# Patient Record
Sex: Female | Born: 2000 | Race: Black or African American | Hispanic: No | Marital: Single | Smoking: Never smoker
Health system: Southern US, Community
[De-identification: ages and names within clinical notes are randomized; demographics above are authoritative.]

## PROBLEM LIST (undated history)

## (undated) ENCOUNTER — Ambulatory Visit (HOSPITAL_COMMUNITY)

## (undated) DIAGNOSIS — R824 Acetonuria: Secondary | ICD-10-CM

## (undated) DIAGNOSIS — L709 Acne, unspecified: Secondary | ICD-10-CM

## (undated) DIAGNOSIS — E119 Type 2 diabetes mellitus without complications: Secondary | ICD-10-CM

## (undated) DIAGNOSIS — E860 Dehydration: Secondary | ICD-10-CM

## (undated) DIAGNOSIS — E109 Type 1 diabetes mellitus without complications: Secondary | ICD-10-CM

## (undated) HISTORY — DX: Type 2 diabetes mellitus without complications: E11.9

## (undated) HISTORY — DX: Dehydration: E86.0

## (undated) HISTORY — DX: Type 1 diabetes mellitus without complications: E10.9

## (undated) HISTORY — PX: PILONIDAL CYST / SINUS EXCISION: SUR543

## (undated) HISTORY — DX: Acetonuria: R82.4

## (undated) HISTORY — DX: Acne, unspecified: L70.9

---
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
## 2018-02-08 ENCOUNTER — Ambulatory Visit (INDEPENDENT_AMBULATORY_CARE_PROVIDER_SITE_OTHER): Payer: BC Managed Care – PPO | Admitting: Pediatric Endocrinology

## 2018-02-08 ENCOUNTER — Encounter (INDEPENDENT_AMBULATORY_CARE_PROVIDER_SITE_OTHER): Payer: Self-pay | Admitting: Pediatric Endocrinology

## 2018-02-08 VITALS — BP 114/60 | HR 64 | Ht 61.5 in | Wt 130.4 lb

## 2018-02-08 DIAGNOSIS — E1065 Type 1 diabetes mellitus with hyperglycemia: Secondary | ICD-10-CM

## 2018-02-08 DIAGNOSIS — Z23 Encounter for immunization: Secondary | ICD-10-CM

## 2018-02-08 DIAGNOSIS — IMO0001 Reserved for inherently not codable concepts without codable children: Secondary | ICD-10-CM

## 2018-02-08 LAB — POCT GLYCOSYLATED HEMOGLOBIN (HGB A1C): HEMOGLOBIN A1C: 9 % — AB (ref 4.0–5.6)

## 2018-02-08 LAB — POCT GLUCOSE (DEVICE FOR HOME USE): POC GLUCOSE: 153 mg/dL — AB (ref 70–99)

## 2018-02-08 MED ORDER — GLUCAGON 3 MG/DOSE NA POWD: 1.0000 | NASAL | 3 refills | Status: DC | PRN

## 2018-02-08 NOTE — Progress Notes (Signed)
Subjective:  Patient Name: Alisha Church Date of Birth: [birth year]  MRN: [id number]  Alisha Church  presents to the office today for follow-up evaluation and management of her type 1 diabetes, growth delay, weight loss, and goiter  HISTORY OF PRESENT ILLNESS:   Alisha Church is a 18 y.o. African-American young lady.   Alisha Church was accompanied by herself   1. Alisha Church was admitted to [facility]'s pediatric ward on 07/19/2010 for new-onset type 1 diabetes mellitus, dehydration, and ketonuria. She had problems with polyuria, polydipsia, and thirst for several weeks prior to admission. She was initially quite dehydrated. Her blood glucose was 450. Her venous pH was 7.39. Her serum bicarbonate was 21. Her urine glucose was greater than 1000. Urine ketones were greater than 40. Hemoglobin A1c was 13.2%. Her insulin C-peptide was 0.52 (normal 0.80 to 3.9). She was started on Lantus insulin as a basal insulin. She was also started on Novolog aspart insulin as her bolus insulin at mealtimes, at bedtime and at 2 AM as needed. She was transitioned to a Medtronic 530G insulin pump 07/25/2012.  2. The patient's last [facility] visit was on 09/02/17. In the interim, she has been generally healthy.      She is still really liking having her T-Slim and Dexcom. She finds it very convenient. She got the email about the Control IQ upgrade and is excited about that.   She was meant to let me know what her sugars were like when she was at school- but she did not.   She has been having some issues with sensors failing early but Dexcom has been good about replacing sensors.   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   She feels that her sugars are variable. Sometimes they are good. And sometimes she feels that she could do better.   She thinks that she could make better food choices. She does feel that she boluses correctly and counts her carbs well. She tries to take at least 25 grams of carbs worth of  insulin before eating.   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  She is usually able to feel her lows.   She is changing her sites about every 3-4 days.   3. Pertinent Review of Systems:  Constitutional: The patient feels "good". The patient seems healthy and active. Eyes: Vision seems to be good with her glasses. There are no recognized eye problems. Her last eye exam was June 2019- got a change in her rx.  Neck: The patient has no complaints of anterior neck swelling, soreness, tenderness, pressure, discomfort, or difficulty swallowing.   Heart: Heart rate increases with exercise or other physical activity. The patient has no complaints of palpitations, irregular heart beats, chest pain, or chest pressure.   Lungs: No asthma or wheezing.,  Gastrointestinal: Bowel movents seem normal. The patient has no complaints of excessive hunger, acid reflux, upset stomach, stomach aches or pains, diarrhea, or constipation.  Legs: Muscle mass and strength seem normal. There are no complaints of numbness, tingling, burning, or pain. No edema is noted.  Feet: There are no obvious foot problems. There are no complaints of numbness, tingling, burning, or pain. No edema is noted. Neurologic: There are no recognized problems with muscle movement and strength, sensation, or coordination. GYN: LMP was 01/10/18  Diabetes ID: Alisha Church - not wearing today.  Wants to get a tattoo.   Annual Labs:  May 2019. No issues  4. CGM and pump download combination:   Avg BG of 225  Avg SG of 200  41% basal insulin  BG high was 468   68% above target   29% in target   3% below target   Testing 6.3 times per day  SG range 40-400 (feels that her 40 was not real)   57% above target   42% in target   1% below target   Some overnight hypoglycemia and a lot of overnight hyperglycemia.   Last visit:   Avg BG 192  Avg SG 187  Range BG 45-500  Range SG 40-400  CGM  time above  target 48%   Time in target 51%   Time below target 1%  46% basal  4.5 glucose values per day  Site change every 3.4 days.         PAST MEDICAL, FAMILY, AND SOCIAL HISTORY  Past Medical History:  Diagnosis Date  . Dehydration   . Diabetes mellitus   . Ketonuria   . Type 1 diabetes mellitus not at goal [facility])     Family History  Problem Relation Age of Onset  . Diabetes Maternal Aunt   . Heart disease Maternal Aunt   . Hypothyroidism Maternal Grandmother   . Thyroid disease Maternal Grandmother   . Heart disease Maternal Grandmother   . Diabetes Cousin   . Thyroid disease Mother      Current Outpatient Medications:  .  adapalene (DIFFERIN) 0.1 % cream, APPLY TO AFFECTED AREA NIGHTLY, Disp: , Rfl:  .  ACCU-CHEK FASTCLIX LANCETS MISC, USE AS DIRECTED 10 TIMES A DAY, Disp: 306 each, Rfl: 0 .  Blood Glucose Monitoring Suppl (ONETOUCH VERIO FLEX SYSTEM) w/Device KIT, 1 kit by Does not apply route as needed. Use to check blood glucose, Disp: 1 kit, Rfl: 6 .  diphenhydrAMINE (BENADRYL) 12.5 MG/5ML liquid, Take 25 mg by mouth 4 (four) times daily as needed. For allergies, Disp: , Rfl:  .  Glucagon (BAQSIMI TWO PACK) 3 MG/DOSE POWD, Place 1 each into the nose as needed (severe hypoglycmia with unresponsiveness)., Disp: 1 each, Rfl: 3 .  glucagon 1 MG injection, Inject 1.0 mg into anterior thigh one time if unconscious, unresponsive, unable to swallow and or has seizure., Disp: 2 each, Rfl: 4 .  glucose blood (BAYER CONTOUR NEXT TEST) test strip, Check glucose 7x daily, Disp: 250 each, Rfl: 5 .  hydrocortisone cream 1 %, Apply 1 application topically as needed. , Disp: , Rfl:  .  insulin aspart (NOVOLOG FLEXPEN) 100 UNIT/ML FlexPen, INJECT UP TO 21 UNITS SUBCUTANEOUSLY BEFORE MEALS, BEDTIME & PER PROTOCOLS FOR HYPERGLYCEMIA & DKA (Patient not taking: Reported on 08/12/2017), Disp: 15 pen, Rfl: 4 .  insulin aspart (NOVOLOG) 100 UNIT/ML injection, USE 300 UNITS IN INSULIN PUMP EVERY 48  HOURS., Disp: 5 vial, Rfl: 5 .  Insulin Glargine (BASAGLAR KWIKPEN) 100 UNIT/ML SOPN, Use up to 50 units daily (Patient not taking: Reported on 08/12/2017), Disp: 5 pen, Rfl: 6 .  Insulin Pen Needle (BD PEN NEEDLE NANO U/F) 32G X 4 MM MISC, Use with insulin pens 6 times daily (Patient not taking: Reported on 09/02/2017), Disp: 200 each, Rfl: 3 .  lidocaine-prilocaine (EMLA) cream, APPLY A SMALL AMOUNT TO SKIN AREA 45 MINUTES PRIOR TO INSERTING INFUSION SET, CLEAN SITE (Patient not taking: Reported on 12/24/2014), Disp: 30 g, Rfl: 2 .  ONETOUCH DELICA LANCETS 53Z MISC, Use to check BG 10x day, Disp: 300 each, Rfl: 4 .  ONETOUCH  VERIO test strip, USE TO TEST GLUCOSE SIX TIMES A DAY. (Patient not taking: Reported on 09/02/2017), Disp: 200 each, Rfl: 5 .  Pediatric Multivit-Minerals-C (KIDS GUMMY BEAR VITAMINS PO), Take 1 tablet by mouth daily., Disp: , Rfl:  .  Selenium Sulf-Pyrithione-Urea 2.25 % SHAM, APPLY TO AFFECTED AREA LEAVE IN PLACE 10 MINS RINSE OFF WITH WATER ONCE DAILY (Patient not taking: Reported on 09/17/2014), Disp: 180 mL, Rfl: 2 .  Selenium Sulfide 2.25 % SHAM, Apply topically., Disp: , Rfl:  .  Throat Lozenges (HALLS JUNIORS MT), Use as directed 1 lozenge in the mouth or throat as needed. For cough, Disp: , Rfl:   Allergies as of 02/08/2018  . (No Known Allergies)     reports that she has never smoked. She has never used smokeless tobacco. She reports that she does not drink alcohol or use drugs. Pediatric History  Patient Parents  . Heckstall,Danita (Mother)  . Muller,Quanji (Father)   Other Topics Concern  . Not on file  Social History Narrative   Lives with Mom, [place]. Boyfriend, 1 sister.    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   Objective:  Vital Signs:  BP 114/60   Pulse 64   Ht 5' 1.5" (1.562 m)   Wt 130 lb 6.4 oz (59.1 kg)   LMP  01/10/2018 (Exact Date)   BMI 24.24 kg/m  Blood pressure percentiles are not available for patients who are 18 years or older.   Ht Readings from Last 3 Encounters:  02/08/18 5' 1.5" (1.562 m) (14 %, Z= -1.09)*  09/02/17 5' 1.3" (1.557 m) (12 %, Z= -1.16)*  08/12/17 5' 1.61" (1.565 m) (15 %, Z= -1.04)*   * Growth percentiles are based on CDC (Girls, 2-20 Years) data.   Wt Readings from Last 3 Encounters:  02/08/18 130 lb 6.4 oz (59.1 kg) (57 %, Z= 0.18)*  09/02/17 129 lb 12.8 oz (58.9 kg) (58 %, Z= 0.20)*  08/12/17 132 lb 9.6 oz (60.1 kg) (63 %, Z= 0.33)*   * Growth percentiles are based on CDC (Girls, 2-20 Years) data.   HC Readings from Last 3 Encounters:  No data found for [facility]   Body surface area is 1.6 meters squared. 14 %ile (Z= -1.09) based on CDC (Girls, 2-20 Years) Stature-for-age data based on Stature recorded on 02/08/2018. 57 %ile (Z= 0.18) based on CDC (Girls, 2-20 Years) weight-for-age data using vitals from 02/08/2018.    PHYSICAL EXAM:  Constitutional: The patient appears healthy and well nourished. The patient's height and weight are normal for age. Weight is stable since last visit  Head: The head is normocephalic. Face: The face appears normal. There are no obvious dysmorphic features. Eyes: The eyes appear to be normally formed and spaced. Gaze is conjugate. There is no obvious arcus or proptosis. Moisture appears normal. Ears: The ears are normally placed and appear externally normal. Mouth: The oropharynx and tongue appear normal. Dentition appears to be normal for age. Oral moisture is normal. Neck: The neck appears to be visibly normal. The thyroid gland is normal for age The consistency of the thyroid gland is firm.. The thyroid gland is not tender to palpation. Lungs: The lungs are clear to auscultation. Air movement is good. Heart: Heart rate and rhythm are regular. Heart sounds S1 and S2 are normal. I did not appreciate any pathologic cardiac  murmurs.  Abdomen: The abdomen appears to be normal in size for the patient's age. Bowel sounds are normal. There is no obvious hepatomegaly, splenomegaly, or other mass effect.  Arms: Muscle size and bulk are normal for age. Hands: There is no obvious tremor. Phalangeal and metacarpophalangeal joints are normal. Palmar muscles are normal for age. Palmar skin is normal. Palmar moisture is also normal. Legs: Muscles appear normal for age. No edema is present. Feet: Feet are normally formed. Neurologic: Strength is normal for age in both the upper and lower extremities. Muscle tone is normal. Sensation to touch is normal in both the legs and feet.     LAB DATA:   Results for orders placed or performed in visit on 02/08/18 (from the past 504 hour(s))  POCT Glucose (Device for Home Use)   Collection Time: 02/08/18  1:29 PM  Result Value Ref Range   Glucose Fasting, POC     POC Glucose 153 (A) 70 - 99 mg/dl  POCT glycosylated hemoglobin (Hb A1C)   Collection Time: 02/08/18  1:34 PM  Result Value Ref Range   Hemoglobin A1C 9.0 (A) 4.0 - 5.6 %   HbA1c POC (<> result, manual entry)     HbA1c, POC (prediabetic range)     HbA1c, POC (controlled diabetic range)          Assessment and Plan:   ASSESSMENT: Samon is an 18 y.o. AA female with type 1 diabetes managed on insulin pump  1. Type 1 diabetes on insulin pump:  - POC A1C as above - POC BG as above - Has not contacted office with sugars - Glucose control is overall higher - Increase in A1C since last visit - No clear pattern for making adjustments. She admits she is inconsistent in her care - Discussed T-Slim Control IQ update 2. Hypoglycemia: rare- She was surprised to find that her T-slim stops giving insulin when she is low.  3. Goiter: Her thyroid gland is stable today.  4. Weight-- Weight is stable   PLAN:   1. Diagnostic: A1C and BG as above. Continue home monitoring. 2. Therapeutic:  Discussed changes since last  visit. Insulin pump settings:  Time Basal Rate Correction factor Carb ratio Target BG   12a 0.75 50 mg/dL 6 150 mg/dL  4a 1.20 50 mg/dL 6 150 mg/dL  6a 1.20 50 mg/dL 6 120 mg/dL  8a 0.95 50 mg/dL 6 120 mg/dL  9p 0.95 50 mg/dL 6 150 mg/dL   Total Basal 23.00 Units      Duration of insulin   3 hours       3. Marland Kitchen.Patient education:  Reviewed Primary school teacher.  Discussed impact of stress on blood sugars. Discussed flu shot today (recommended for all T1DM patients).  4. Follow-up: Return in about 3 months (around 05/10/2018).       Level of Service: This visit lasted in excess of 25 minutes. More than 50% of the visit was devoted to counseling. When a patient is on insulin, intensive monitoring of blood glucose levels is necessary to avoid hyperglycemia and hypoglycemia. Severe hyperglycemia/hypoglycemia can lead to hospital admissions and be life threatening.    Lelon Huh, MD

## 2018-02-08 NOTE — Patient Instructions (Addendum)
Flu shot today! Remember to move that arm! It will take 2 weeks for full immune effect. This injection may not prevent flu but should reduce severity of disease.   Baqsimi prescription sent to pharmacy.   You should get an email from [employer] on how to upgrade to Control IQ No changes to pump settings.   Rules of 150: Total carbs for day <150 grams Total exercise for week >150 minutes Target blood sugar 150   Insulin pump settings:  Time Basal Rate Correction factor Carb ratio Target BG   12a 0.75 50 mg/dL 6 428 mg/dL  4a 7.68 50 mg/dL 6 115 mg/dL  6a 7.26 50 mg/dL 6 203 mg/dL  8a 5.59 50 mg/dL 6 741 mg/dL  9p 6.38 50 mg/dL 6 453 mg/dL   Total Basal 64.68 Units      Duration of insulin   3 hours

## 2018-02-23 ENCOUNTER — Telehealth (INDEPENDENT_AMBULATORY_CARE_PROVIDER_SITE_OTHER): Payer: Self-pay

## 2018-02-23 NOTE — Telephone Encounter (Signed)
Left voicemail for patient and mom to call back so that we can talk to her about the Baqsimi  When they call back we need to give them the number to [facility]. [phone].  Baqsimi is not on their formulary so we are unable to push further on an appeal for the denied PA. They can contact [facility] and see if there are any copay assistance programs they can apply for.

## 2018-05-08 ENCOUNTER — Encounter (INDEPENDENT_AMBULATORY_CARE_PROVIDER_SITE_OTHER): Payer: Self-pay | Admitting: Pediatric Endocrinology

## 2018-05-10 ENCOUNTER — Other Ambulatory Visit: Payer: Self-pay

## 2018-05-10 ENCOUNTER — Ambulatory Visit (INDEPENDENT_AMBULATORY_CARE_PROVIDER_SITE_OTHER): Payer: BC Managed Care – PPO | Admitting: Pediatric Endocrinology

## 2018-05-10 ENCOUNTER — Encounter (INDEPENDENT_AMBULATORY_CARE_PROVIDER_SITE_OTHER): Payer: Self-pay | Admitting: Pediatric Endocrinology

## 2018-05-10 VITALS — BP 118/70 | HR 76 | Ht 61.42 in | Wt 135.4 lb

## 2018-05-10 DIAGNOSIS — E1065 Type 1 diabetes mellitus with hyperglycemia: Secondary | ICD-10-CM

## 2018-05-10 DIAGNOSIS — Z4681 Encounter for fitting and adjustment of insulin pump: Secondary | ICD-10-CM | POA: Diagnosis not present

## 2018-05-10 DIAGNOSIS — E11649 Type 2 diabetes mellitus with hypoglycemia without coma: Secondary | ICD-10-CM | POA: Diagnosis not present

## 2018-05-10 DIAGNOSIS — IMO0001 Reserved for inherently not codable concepts without codable children: Secondary | ICD-10-CM

## 2018-05-10 LAB — POCT GLYCOSYLATED HEMOGLOBIN (HGB A1C): Hemoglobin A1C: 8 % — AB (ref 4.0–5.6)

## 2018-05-10 LAB — POCT GLUCOSE (DEVICE FOR HOME USE): Glucose Fasting, POC: 96 mg/dL (ref 70–99)

## 2018-05-10 NOTE — Progress Notes (Signed)
Subjective:  Patient Name: Alisha Church Date of Birth: September 20, 2000  MRN: [id number]  Alisha Church  presents to the office today for follow-up evaluation and management of her type 1 diabetes, growth delay, weight loss, and goiter  HISTORY OF PRESENT ILLNESS:   Alisha Church is a 18 y.o. African-American young lady.   Alisha Church was accompanied by her mom  1. Alisha Church was admitted to [facility]'s pediatric ward on 07/19/2010 for new-onset type 1 diabetes mellitus, dehydration, and ketonuria. She had problems with polyuria, polydipsia, and thirst for several weeks prior to admission. She was initially quite dehydrated. Her blood glucose was 450. Her venous pH was 7.39. Her serum bicarbonate was 21. Her urine glucose was greater than 1000. Urine ketones were greater than 40. Hemoglobin A1c was 13.2%. Her insulin C-peptide was 0.52 (normal 0.80 to 3.9). She was started on Lantus insulin as a basal insulin. She was also started on Novolog aspart insulin as her bolus insulin at mealtimes, at bedtime and at 2 AM as needed. She was transitioned to a Medtronic 530G insulin pump 07/25/2012.  2. The patient's last [facility] visit was on 02/08/2018. In the interim, she has been generally healthy.     She has the Control IQ operating system now on her T-Slim and likes it a lot. She has still managed to have a few lows (one where she put her carbs into the units screen). Overall she feels that she is not having as much hypoglycemia as previously.   She is working out a lot more often. She is trying to be more conscientious about what she is eating.   She is living at home now that they closed the dorms. Alisha Church feels that she is eating less now that she is back home.   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   She is still very nervous about getting low at night. When she sees her sugar below 150 mg/dL at bedtime she will try to eat something to raise it. She is having a hard time trusting her  system to not let her get hypoglycemic.   She has been bolusing before eating more often.   She has been working at [employer]- she has been working mostly in the back. She is no longer having hypoglycemia at work.   3. Pertinent Review of Systems:  Constitutional: The patient feels "good". The patient seems healthy and active. Eyes: Vision seems to be good with her glasses. There are no recognized eye problems. Her last eye exam was June 2019- got a change in her rx.  Neck: The patient has no complaints of anterior neck swelling, soreness, tenderness, pressure, discomfort, or difficulty swallowing.   Heart: Heart rate increases with exercise or other physical activity. The patient has no complaints of palpitations, irregular heart beats, chest pain, or chest pressure.   Lungs: No asthma or wheezing.,  Gastrointestinal: Bowel movents seem normal. The patient has no complaints of excessive hunger, acid reflux, upset stomach, stomach aches or pains, diarrhea, or constipation.  Legs: Muscle mass and strength seem normal. There are no complaints of numbness, tingling, burning, or pain. No edema is noted.  Feet: There are no obvious foot problems. There are no complaints of numbness, tingling, burning, or pain. No edema is noted. Neurologic: There are no recognized problems with muscle movement and strength, sensation, or coordination. GYN: LMP was 04/15/18  Diabetes ID: charm bracelet - not wearing today.   Annual Labs:  May 2019. No issues  Due next visit  Pump settings Time Basal Rate Correction factor Carb ratio Target BG   12a 0.75 50 mg/dL 6 150 mg/dL  4a 1.20 50 mg/dL 6 150 mg/dL  6a 1.20 50 mg/dL 6 120 mg/dL  8a 0.95 50 mg/dL 6 120 mg/dL  9p 0.95 50 mg/dL 6 150 mg/dL   Total Basal 23.00 Units      Duration of insulin   3 hours      4. CGM and pump download combination:   Avg BG 229 +/- 54. Range 141-329   >250 25%    181-249 58%   80-180 2%  avg SG 177 +/69.  44-400   >250 18%   181-249 22%    80-180 58%   71-79 1%   <70 1%   Avg basal 41% of total daily insulin  Very stable blood sugars during the day. Some higher sugars after eating and overnight.         Last visit:   Avg BG of 225  Avg SG of 200  41% basal insulin  BG high was 468   68% above target   29% in target   3% below target   Testing 6.3 times per day  SG range 40-400 (feels that her 40 was not real)   57% above target   42% in target   1% below target   Some overnight hypoglycemia and a lot of overnight hyperglycemia.         PAST MEDICAL, FAMILY, AND SOCIAL HISTORY  Past Medical History:  Diagnosis Date  . Dehydration   . Diabetes mellitus   . Ketonuria   . Type 1 diabetes mellitus not at goal [facility])     Family History  Problem Relation Age of Onset  . Diabetes Maternal Aunt   . Heart disease Maternal Aunt   . Hypothyroidism Maternal Grandmother   . Thyroid disease Maternal Grandmother   . Heart disease Maternal Grandmother   . Diabetes Cousin   . Thyroid disease Mother      Current Outpatient Medications:  .  ACCU-CHEK FASTCLIX LANCETS MISC, USE AS DIRECTED 10 TIMES A DAY, Disp: 306 each, Rfl: 0 .  adapalene (DIFFERIN) 0.1 % cream, APPLY TO AFFECTED AREA NIGHTLY, Disp: , Rfl:  .  Blood Glucose Monitoring Suppl (ONETOUCH VERIO FLEX SYSTEM) w/Device KIT, 1 kit by Does not apply route as needed. Use to check blood glucose, Disp: 1 kit, Rfl: 6 .  glucagon 1 MG injection, Inject 1.0 mg into anterior thigh one time if unconscious, unresponsive, unable to swallow and or has seizure., Disp: 2 each, Rfl: 4 .  insulin aspart (NOVOLOG) 100 UNIT/ML injection, USE 300 UNITS IN INSULIN PUMP EVERY 48 HOURS., Disp: 5 vial, Rfl: 5 .  Pediatric Multivit-Minerals-C (KIDS GUMMY BEAR VITAMINS PO), Take 1 tablet by mouth daily., Disp: , Rfl:  .  Selenium Sulf-Pyrithione-Urea 2.25 % SHAM, APPLY TO AFFECTED AREA LEAVE IN PLACE 10 MINS RINSE OFF WITH WATER ONCE DAILY, Disp:  180 mL, Rfl: 2 .  diphenhydrAMINE (BENADRYL) 12.5 MG/5ML liquid, Take 25 mg by mouth 4 (four) times daily as needed. For allergies, Disp: , Rfl:  .  Glucagon (BAQSIMI TWO PACK) 3 MG/DOSE POWD, Place 1 each into the nose as needed (severe hypoglycmia with unresponsiveness). (Patient not taking: Reported on 05/10/2018), Disp: 1 each, Rfl: 3 .  glucose blood (BAYER CONTOUR NEXT TEST) test strip, Check glucose 7x daily (Patient not taking: Reported on 05/10/2018), Disp: 250 each, Rfl: 5 .  hydrocortisone cream 1 %, Apply 1 application topically as needed. , Disp: , Rfl:  .  insulin aspart (NOVOLOG FLEXPEN) 100 UNIT/ML FlexPen, INJECT UP TO 21 UNITS SUBCUTANEOUSLY BEFORE MEALS, BEDTIME & PER PROTOCOLS FOR HYPERGLYCEMIA & DKA (Patient not taking: Reported on 08/12/2017), Disp: 15 pen, Rfl: 4 .  Insulin Glargine (BASAGLAR KWIKPEN) 100 UNIT/ML SOPN, Use up to 50 units daily (Patient not taking: Reported on 08/12/2017), Disp: 5 pen, Rfl: 6 .  Insulin Pen Needle (BD PEN NEEDLE NANO U/F) 32G X 4 MM MISC, Use with insulin pens 6 times daily (Patient not taking: Reported on 09/02/2017), Disp: 200 each, Rfl: 3 .  ONETOUCH DELICA LANCETS 96E MISC, Use to check BG 10x day (Patient not taking: Reported on 05/10/2018), Disp: 300 each, Rfl: 4 .  ONETOUCH VERIO test strip, USE TO TEST GLUCOSE SIX TIMES A DAY. (Patient not taking: Reported on 09/02/2017), Disp: 200 each, Rfl: 5 .  Throat Lozenges (HALLS JUNIORS MT), Use as directed 1 lozenge in the mouth or throat as needed. For cough, Disp: , Rfl:   Allergies as of 05/10/2018  . (No Known Allergies)     reports that she has never smoked. She has never used smokeless tobacco. She reports that she does not drink alcohol or use drugs. Pediatric History  Patient Parents  . Heckstall,Danita (Mother)  . Maille,Quanji (Father)   Other Topics Concern  . Not on file  Social History Narrative   Lives with Mom, [place]. Boyfriend, 1 sister.    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  Primary Care Provider: Berkley Harvey, NP  ROS: There are no other significant problems involving Kiyoko's other body systems.   Objective:  Vital Signs:  BP 118/70   Pulse 76   Ht 5' 1.42" (1.56 m)   Wt 135 lb 6.4 oz (61.4 kg)   BMI 25.24 kg/m  Blood pressure percentiles are not available for patients who are 18 years or older.   Ht Readings from Last 3 Encounters:  05/10/18 5' 1.42" (1.56 m) (13 %, Z= -1.12)*  02/08/18 5' 1.5" (1.562 m) (14 %, Z= -1.09)*  09/02/17 5' 1.3" (1.557 m) (12 %, Z= -1.16)*   * Growth percentiles are based on CDC (Girls, 2-20 Years) data.   Wt Readings from Last 3 Encounters:  05/10/18 135 lb 6.4 oz (61.4 kg) (64 %, Z= 0.36)*  02/08/18 130 lb 6.4 oz (59.1 kg) (57 %, Z= 0.18)*  09/02/17 129 lb 12.8 oz (58.9 kg) (58 %, Z= 0.20)*   * Growth percentiles are based on CDC (Girls, 2-20 Years) data.   HC Readings from Last 3 Encounters:  No data found for [facility]   Body surface area is 1.63 meters squared. 13 %ile (Z= -1.12) based on CDC (Girls, 2-20 Years) Stature-for-age data based on Stature recorded on 05/10/2018. 64 %ile (Z= 0.36) based on CDC (Girls, 2-20 Years) weight-for-age data using vitals from 05/10/2018.    PHYSICAL EXAM:  Constitutional: The patient appears healthy and well nourished. The patient's height and weight are normal for age. Weight is + 4 pounds since last visit  Head: The head is normocephalic. Face: The face appears normal. There are no obvious dysmorphic features. Eyes: The eyes appear to be normally formed and spaced. Gaze is conjugate. There is no obvious arcus or proptosis. Moisture appears normal. Ears: The ears are normally placed and appear externally normal. Mouth: The oropharynx and tongue appear normal. Dentition appears to be normal for  age. Oral moisture is normal. Neck: The neck appears to be visibly normal. The thyroid gland is normal for age The consistency of the thyroid gland is firm.. The  thyroid gland is not tender to palpation. Lungs: The lungs are clear to auscultation. Air movement is good. Heart: Heart rate and rhythm are regular. Heart sounds S1 and S2 are normal. I did not appreciate any pathologic cardiac murmurs. Abdomen: The abdomen appears to be normal in size for the patient's age. Bowel sounds are normal. There is no obvious hepatomegaly, splenomegaly, or other mass effect.  Arms: Muscle size and bulk are normal for age. Hands: There is no obvious tremor. Phalangeal and metacarpophalangeal joints are normal. Palmar muscles are normal for age. Palmar skin is normal. Palmar moisture is also normal. Legs: Muscles appear normal for age. No edema is present. Feet: Feet are normally formed. Neurologic: Strength is normal for age in both the upper and lower extremities. Muscle tone is normal. Sensation to touch is normal in both the legs and feet.   Skin: some scarring around pump site on right buttock  LAB DATA:   Results for orders placed or performed in visit on 05/10/18 (from the past 504 hour(s))  POCT Glucose (Device for Home Use)   Collection Time: 05/10/18  4:03 PM  Result Value Ref Range   Glucose Fasting, POC 96 70 - 99 mg/dL   POC Glucose    POCT glycosylated hemoglobin (Hb A1C)   Collection Time: 05/10/18  4:12 PM  Result Value Ref Range   Hemoglobin A1C 8.0 (A) 4.0 - 5.6 %   HbA1c POC (<> result, manual entry)     HbA1c, POC (prediabetic range)     HbA1c, POC (controlled diabetic range)       Last A1C 9%    Assessment and Plan:   ASSESSMENT: Zali is an 18 y.o. AA female with type 1 diabetes managed on insulin pump   1. Type 1 diabetes on insulin pump:  - POC A1C as above - POC BG as above - Glucose control is overall tighter - decrease in A1C since last visit - Glycemic control is very stable during the day and higher at night - she has had fewer hypoglycemic episodes  2. Hypoglycemia: rare- Control IQ is preventing most hypoglycemic  episodes 3. Goiter: Her thyroid gland is stable today.    PLAN:  1. Diagnostic: A1C and BG as above. Continue home monitoring. Annual labs next visit 2. Therapeutic:  Discussed changes since last visit. Insulin pump settings:  Time Basal Rate Correction factor Carb ratio Target BG   12a 0.75 -> 0.85 50 -> 42m/dL 6 150 mg/dL  4a 1.20 50-> 40 mg/dL 6 150 mg/dL  6a 1.20 50 -> 40 mg/dL 6 120 mg/dL  8a 0.95 50 -> 46mdL 6 120 mg/dL  9p 0.95 -> 1.05 50 -> 4057mL 6 150 mg/dL   Total Basal 23.00 Units -> 23.7     Duration of insulin   3 hours        3. ..PMarland Kitchentient education:  Reviewed pumPrimary school teacherTitrated pump settings as above.  4. Follow-up: Return in about 3 months (around 08/09/2018).       Level of Service: This visit lasted in excess of 40 minutes. More than 50% of the visit was devoted to counseling. When a patient is on insulin, intensive monitoring of blood glucose levels is necessary to avoid hyperglycemia and hypoglycemia. Severe hyperglycemia/hypoglycemia can lead to hospital admissions and be life  threatening.    Lelon Huh, MD

## 2018-05-10 NOTE — Patient Instructions (Addendum)
  Time Basal Rate Correction factor Carb ratio Target BG   12a 0.75 -> 0.85 50 -> 40mg /dL 6 929 mg/dL  4a 5.74 73-> 40 mg/dL 6 403 mg/dL  6a 7.09 50 -> 40 mg/dL 6 643 mg/dL  8a 8.38 50 -> 40mg /dL 6 184 mg/dL  9p 0.37 -> 5.43 50 -> 40mg /dL 6 606 mg/dL   Total Basal 77.03 Units -> 23.7     Duration of insulin   3 hours

## 2018-08-16 ENCOUNTER — Ambulatory Visit (INDEPENDENT_AMBULATORY_CARE_PROVIDER_SITE_OTHER): Payer: BC Managed Care – PPO | Admitting: Pediatric Endocrinology

## 2018-09-07 ENCOUNTER — Emergency Department (HOSPITAL_COMMUNITY)
Admission: EM | Admit: 2018-09-07 | Discharge: 2018-09-07 | Disposition: A | Discharge status: Home / Self Care | Payer: BC Managed Care – PPO | Attending: Emergency Medicine | Admitting: Emergency Medicine

## 2018-09-07 ENCOUNTER — Other Ambulatory Visit: Payer: Self-pay

## 2018-09-07 ENCOUNTER — Encounter (HOSPITAL_COMMUNITY): Payer: Self-pay | Admitting: Emergency Medicine

## 2018-09-07 DIAGNOSIS — N3000 Acute cystitis without hematuria: Secondary | ICD-10-CM | POA: Diagnosis not present

## 2018-09-07 DIAGNOSIS — Z794 Long term (current) use of insulin: Secondary | ICD-10-CM | POA: Insufficient documentation

## 2018-09-07 DIAGNOSIS — R102 Pelvic and perineal pain: Secondary | ICD-10-CM | POA: Diagnosis present

## 2018-09-07 DIAGNOSIS — E109 Type 1 diabetes mellitus without complications: Secondary | ICD-10-CM | POA: Diagnosis not present

## 2018-09-07 DIAGNOSIS — Z79899 Other long term (current) drug therapy: Secondary | ICD-10-CM | POA: Insufficient documentation

## 2018-09-07 LAB — URINALYSIS, ROUTINE W REFLEX MICROSCOPIC
Bilirubin Urine: NEGATIVE
Glucose, UA: >=500 mg/dL — AB
Hgb urine dipstick: NEGATIVE
Ketones, ur: 80 mg/dL — AB
Leukocytes,Ua: NEGATIVE
Nitrite: NEGATIVE
Protein, ur: NEGATIVE mg/dL
Specific Gravity, Urine: 1.023 (ref 1.005–1.030)
pH: 5 (ref 5.0–8.0)

## 2018-09-07 LAB — POC URINE PREG, ED: Preg Test, Ur: NEGATIVE

## 2018-09-07 LAB — CBG MONITORING, ED: Glucose-Capillary: 164 mg/dL — ABNORMAL HIGH (ref 70–99)

## 2018-09-07 MED ORDER — CEPHALEXIN 500 MG PO CAPS: 500.0000 mg | ORAL_CAPSULE | Freq: Three times a day (TID) | ORAL | 0 refills | Status: DC

## 2018-09-07 MED ORDER — CEPHALEXIN 500 MG PO CAPS
500.0000 mg | ORAL_CAPSULE | Freq: Once | ORAL | Status: AC
  Administered 2018-09-07: 500 mg via ORAL
  Filled 2018-09-07: qty 1

## 2018-09-07 MED ORDER — PHENAZOPYRIDINE HCL 100 MG PO TABS
200.0000 mg | ORAL_TABLET | Freq: Once | ORAL | Status: AC
  Administered 2018-09-07: 02:00:00 200 mg via ORAL
  Filled 2018-09-07: qty 2

## 2018-09-07 MED ORDER — PHENAZOPYRIDINE HCL 200 MG PO TABS: 200.0000 mg | ORAL_TABLET | Freq: Three times a day (TID) | ORAL | 0 refills | Status: DC

## 2018-09-07 NOTE — ED Provider Notes (Signed)
[facility] EMERGENCY DEPARTMENT Provider Note   CSN: [id number] Arrival date & time: 09/07/18  0016   Time seen 1:30 AM  History   Chief Complaint Chief Complaint  Patient presents with  . Pelvic Pain    HPI Alisha Church is a 18 y.o. female.     HPI patient states this morning, August 11 when she woke up she started having pressure over her bladder area.  She has had dysuria, frequency, urinating small amounts but without hematuria.  She denies nausea, vomiting, fever, or flank pain.  She denies any vaginal discharge.  She denies any sexual activity at all.  She states she has never had this before.  She states her CBGs are usually in the 130 range, during my exam it said it was 194.  She denies any chest pain or shortness of breath.  PCP Berkley Harvey, NP   Past Medical History:  Diagnosis Date  . Dehydration   . Diabetes mellitus   . Ketonuria   . Type 1 diabetes mellitus not at goal [facility])     Patient Active Problem List   Diagnosis Date Noted  . Weight loss, unintentional 04/17/2014  . Vaginal candidiasis 02/26/2014  . History of pilonidal cyst 02/26/2014  . 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 Insulin pump titration 08/02/2012  . Overweight(278.02) 04/07/2012  . Sports physical 10/22/2011  . Hypoglycemia associated with diabetes ([place]) 05/04/2011  . Type 1 diabetes mellitus not at goal [facility])   . Goiter     Past Surgical History:  Procedure Laterality Date  . PILONIDAL CYST / SINUS EXCISION       OB History   No obstetric history on file.      Home Medications    Prior to Admission medications   Medication Sig Start Date End Date Taking? Authorizing Provider  ACCU-CHEK FASTCLIX LANCETS MISC USE AS DIRECTED 10 TIMES A DAY 06/24/16   Lelon Huh, MD  adapalene (DIFFERIN) 0.1 % cream APPLY TO AFFECTED AREA NIGHTLY 08/13/16   [provider]  Blood Glucose Monitoring Suppl (Joiner)  w/Device KIT 1 kit by Does not apply route as needed. Use to check blood glucose 02/05/15   Lelon Huh, MD  cephALEXin (KEFLEX) 500 MG capsule Take 1 capsule (500 mg total) by mouth 3 (three) times daily. 09/07/18   Rolland Porter, MD  diphenhydrAMINE (BENADRYL) 12.5 MG/5ML liquid Take 25 mg by mouth 4 (four) times daily as needed. For allergies    [provider]  Glucagon (BAQSIMI TWO PACK) 3 MG/DOSE POWD Place 1 each into the nose as needed (severe hypoglycmia with unresponsiveness). Patient not taking: Reported on 05/10/2018 02/08/18   Lelon Huh, MD  glucagon 1 MG injection Inject 1.0 mg into anterior thigh one time if unconscious, unresponsive, unable to swallow and or has seizure. 06/24/16   Lelon Huh, MD  glucose blood (BAYER CONTOUR NEXT TEST) test strip Check glucose 7x daily Patient not taking: Reported on 05/10/2018 11/07/14   Lelon Huh, MD  hydrocortisone cream 1 % Apply 1 application topically as needed.     [provider]  insulin aspart (NOVOLOG FLEXPEN) 100 UNIT/ML FlexPen INJECT UP TO 21 UNITS SUBCUTANEOUSLY BEFORE MEALS, BEDTIME & PER PROTOCOLS FOR HYPERGLYCEMIA & DKA Patient not taking: Reported on 08/12/2017 05/08/17   Lelon Huh, MD  insulin aspart (NOVOLOG) 100 UNIT/ML injection USE 300 UNITS IN INSULIN PUMP EVERY 48 HOURS. 09/28/17   Lelon Huh, MD  Insulin  Glargine (BASAGLAR KWIKPEN) 100 UNIT/ML SOPN Use up to 50 units daily Patient not taking: Reported on 08/12/2017 05/08/17   Lelon Huh, MD  Insulin Pen Needle (BD PEN NEEDLE NANO U/F) 32G X 4 MM MISC Use with insulin pens 6 times daily Patient not taking: Reported on 09/02/2017 05/23/12   Lelon Huh, MD  [facility] DELICA LANCETS 45W MISC Use to check BG 10x day Patient not taking: Reported on 05/10/2018 10/03/15   Lelon Huh, MD  [facility] VERIO test strip USE TO TEST GLUCOSE SIX TIMES A DAY. Patient not taking: Reported on 09/02/2017 07/16/17   Lelon Huh, MD  Pediatric  Multivit-Minerals-C (KIDS GUMMY BEAR VITAMINS PO) Take 1 tablet by mouth daily.    [provider]  phenazopyridine (PYRIDIUM) 200 MG tablet Take 1 tablet (200 mg total) by mouth 3 (three) times daily. 09/07/18   Rolland Porter, MD  Selenium Sulf-Pyrithione-Urea 2.25 % SHAM APPLY TO AFFECTED AREA LEAVE IN PLACE 10 MINS RINSE OFF WITH WATER ONCE DAILY    Prose, Hurshel Keys, MD  Throat Lozenges (HALLS JUNIORS MT) Use as directed 1 lozenge in the mouth or throat as needed. For cough    [provider]    Family History Family History  Problem Relation Age of Onset  . Diabetes Maternal Aunt   . Heart disease Maternal Aunt   . Hypothyroidism Maternal Grandmother   . Thyroid disease Maternal Grandmother   . Heart disease Maternal Grandmother   . Diabetes Cousin   . Thyroid disease Mother     Social History Social History   Tobacco Use  . Smoking status: Never Smoker  . Smokeless tobacco: Never Used  Substance Use Topics  . Alcohol use: No  . Drug use: No     Allergies   Patient has no known allergies.   Review of Systems Review of Systems  All other systems reviewed and are negative.    Physical Exam Updated Vital Signs BP 139/79 (BP Location: Right Arm)   Pulse 71   Temp 98.3 F (36.8 C) (Oral)   Resp 17   Ht 5' 1" (1.549 m)   Wt 61.7 kg   LMP 08/10/2018   SpO2 100%   BMI 25.70 kg/m   Vital signs normal    Physical Exam Vitals signs and nursing note reviewed.  Constitutional:      General: She is not in acute distress.    Appearance: Normal appearance. She is well-developed. She is not ill-appearing or toxic-appearing.  HENT:     Head: Normocephalic and atraumatic.     Right Ear: External ear normal.     Left Ear: External ear normal.     Nose: Nose normal. No mucosal edema or rhinorrhea.     Mouth/Throat:     Dentition: No dental abscesses.     Pharynx: No uvula swelling.  Eyes:     Extraocular Movements: Extraocular movements intact.      Conjunctiva/sclera: Conjunctivae normal.     Pupils: Pupils are equal, round, and reactive to light.  Neck:     Musculoskeletal: Full passive range of motion without pain, normal range of motion and neck supple.  Cardiovascular:     Rate and Rhythm: Normal rate and regular rhythm.     Heart sounds: Normal heart sounds. No murmur. No friction rub. No gallop.   Pulmonary:     Effort: Pulmonary effort is normal. No respiratory distress.     Breath sounds: Normal breath sounds. No wheezing, rhonchi or rales.  Chest:     Chest wall: No tenderness or crepitus.  Abdominal:     General: Bowel sounds are normal. There is no distension.     Palpations: Abdomen is soft.     Tenderness: There is abdominal tenderness in the suprapubic area. There is no right CVA tenderness, left CVA tenderness, guarding or rebound.  Musculoskeletal: Normal range of motion.        General: No tenderness.     Comments: Moves all extremities well.   Skin:    General: Skin is warm and dry.     Coloration: Skin is not pale.     Findings: No erythema or rash.  Neurological:     Mental Status: She is alert and oriented to person, place, and time.     Cranial Nerves: No cranial nerve deficit.  Psychiatric:        Mood and Affect: Mood is not anxious.        Speech: Speech normal.        Behavior: Behavior normal.      ED Treatments / Results  Labs (all labs ordered are listed, but only abnormal results are displayed) Results for orders placed or performed during the hospital encounter of 09/07/18  Urinalysis, Routine w reflex microscopic  Result Value Ref Range   Color, Urine YELLOW YELLOW   APPearance HAZY (A) CLEAR   Specific Gravity, Urine 1.023 1.005 - 1.030   pH 5.0 5.0 - 8.0   Glucose, UA >=500 (A) NEGATIVE mg/dL   Hgb urine dipstick NEGATIVE NEGATIVE   Bilirubin Urine NEGATIVE NEGATIVE   Ketones, ur 80 (A) NEGATIVE mg/dL   Protein, ur NEGATIVE NEGATIVE mg/dL   Nitrite NEGATIVE NEGATIVE    Leukocytes,Ua NEGATIVE NEGATIVE   RBC / HPF 0-5 0 - 5 RBC/hpf   WBC, UA 0-5 0 - 5 WBC/hpf   Bacteria, UA RARE (A) NONE SEEN   Squamous Epithelial / LPF 0-5 0 - 5   Mucus PRESENT   POC Urine Pregnancy, ED (not at [facility])  Result Value Ref Range   Preg Test, Ur NEGATIVE NEGATIVE  CBG monitoring, ED  Result Value Ref Range   Glucose-Capillary 164 (H) 70 - 99 mg/dL   Laboratory interpretation all normal except glucosuria, mild hyperglycemia, mild ketosis in urine.    EKG None  Radiology No results found.  Procedures Procedures (including critical care time)  Medications Ordered in ED Medications  phenazopyridine (PYRIDIUM) tablet 200 mg (has no administration in time range)  cephALEXin (KEFLEX) capsule 500 mg (has no administration in time range)     Initial Impression / Assessment and Plan / ED Course  I have reviewed the triage vital signs and the nursing notes.  Pertinent labs & imaging results that were available during my care of the patient were reviewed by me and considered in my medical decision making (see chart for details).     Patient looks comfortable at rest.  She does not appear to be in DKA, she is not having any tachypnea or chest pain.  She does have some lower abdominal pain however she denies any prior sexual activity at all, her urinalysis does not specifically look like a UTI although her symptoms sound like a urinary tract infection.  I do not suspect any other underlying process such as diverticulitis or appendicitis although if her symptoms get worse that would be something to consider.  Final Clinical Impressions(s) / ED Diagnoses   Final diagnoses:  Acute cystitis without hematuria      ED Discharge Orders         Ordered    phenazopyridine (PYRIDIUM) 200 MG tablet  3 times daily     09/07/18 0156    cephALEXin (KEFLEX) 500 MG capsule  3 times daily     09/07/18 0156         Plan discharge  Rolland Porter, MD, Barbette Or, MD  09/07/18 0330

## 2018-09-07 NOTE — Discharge Instructions (Signed)
Drink plenty of fluids, continue to monitor your blood sugar closely as you are doing.  Take the antibiotics until gone, take the Pyridium 3 times a day for the discomfort with urination.  Please wear old underwear or pantiliners because it will stain if you have leakage.  Recheck if you get fever, vomiting, pain in your back or you seem worse instead of better.

## 2018-09-07 NOTE — ED Triage Notes (Signed)
Pt stating when she uses the bathroom it feels like "my bladder is hurting." Pt describes it as and aching and pressure pain. Pt reports this problem began around 0800 this morning. Denies fevers and N/V.

## 2018-09-08 LAB — GC/CHLAMYDIA PROBE AMP (~~LOC~~) NOT AT ARMC
Chlamydia: NEGATIVE
Neisseria Gonorrhea: NEGATIVE

## 2018-10-25 ENCOUNTER — Ambulatory Visit (INDEPENDENT_AMBULATORY_CARE_PROVIDER_SITE_OTHER): Payer: BC Managed Care – PPO | Admitting: Pediatric Endocrinology

## 2018-10-25 ENCOUNTER — Encounter (INDEPENDENT_AMBULATORY_CARE_PROVIDER_SITE_OTHER): Payer: Self-pay | Admitting: Pediatric Endocrinology

## 2018-10-25 ENCOUNTER — Other Ambulatory Visit: Payer: Self-pay

## 2018-10-25 VITALS — BP 108/68 | HR 94 | Wt 131.0 lb

## 2018-10-25 DIAGNOSIS — E1065 Type 1 diabetes mellitus with hyperglycemia: Secondary | ICD-10-CM | POA: Diagnosis not present

## 2018-10-25 DIAGNOSIS — IMO0001 Reserved for inherently not codable concepts without codable children: Secondary | ICD-10-CM

## 2018-10-25 LAB — POCT GLUCOSE (DEVICE FOR HOME USE): POC Glucose: 274 mg/dl — AB (ref 70–99)

## 2018-10-25 LAB — POCT GLYCOSYLATED HEMOGLOBIN (HGB A1C): Hemoglobin A1C: 7.4 % — AB (ref 4.0–5.6)

## 2018-10-25 NOTE — Patient Instructions (Signed)
No changes to settings. Work on carb counting. Use temp basal after skating.    Time Basal Rate Correction factor Carb ratio Target BG   12a 0.85 40mg /dL 6 150 mg/dL  4a 1.20 40 mg/dL 6 150 mg/dL  6a 1.20 40 mg/dL 6 120 mg/dL  8a 0.95 40mg /dL 6 120 mg/dL  9p 1.05 40mg /dL 6 150 mg/dL   Total Basal  23.7     Duration of insulin   3 hours

## 2018-10-25 NOTE — Progress Notes (Signed)
Subjective:  Patient Name: [facility] Date of Birth: 2000/03/11  MRN: [id number]  Wetona Viramontes  presents to the office today for follow-up evaluation and management of her type 1 diabetes, growth delay, weight loss, and goiter  HISTORY OF PRESENT ILLNESS:   Amali is a 18 y.o. African-American young lady.   Domique was accompanied by her mom  1. Kindsey was admitted to [facility]'s pediatric ward on 07/19/2010 for new-onset type 1 diabetes mellitus, dehydration, and ketonuria. She had problems with polyuria, polydipsia, and thirst for several weeks prior to admission. She was initially quite dehydrated. Her blood glucose was 450. Her venous pH was 7.39. Her serum bicarbonate was 21. Her urine glucose was greater than 1000. Urine ketones were greater than 40. Hemoglobin A1c was 13.2%. Her insulin C-peptide was 0.52 (normal 0.80 to 3.9). She was started on Lantus insulin as a basal insulin. She was also started on Novolog aspart insulin as her bolus insulin at mealtimes, at bedtime and at 2 AM as needed. She was transitioned to a Medtronic 530G insulin pump 07/25/2012.  2. The patient's last [facility] visit was on 05/10/2018. In the interim, she has been generally healthy.   She has not been in the hospital or ER.   She has Control IQ on her pump. She feels that it does ok preventing hypoglycemia but it is not as good at hyperglycemia. She has had lower blood sugars overnight after skating in the evenings.   She feels that her carb counting is "good" but not "excellent". She is working on eating lower carb. She thinks that she tends to eat more carbs at dinner than at lunch. She is bolusing before she eats.   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 ([place])  3. Pertinent Review of Systems:  Constitutional: The patient feels "pretty good". The patient seems healthy and active. Eyes: Vision seems to be good with her glasses. There are no recognized eye problems. Her last eye exam was  June 2019- got a change in her rx.  Neck: The patient has no complaints of anterior neck swelling, soreness, tenderness, pressure, discomfort, or difficulty swallowing.   Heart: Heart rate increases with exercise or other physical activity. The patient has no complaints of palpitations, irregular heart beats, chest pain, or chest pressure.   Lungs: No asthma or wheezing.,  Gastrointestinal: Bowel movents seem normal. The patient has no complaints of excessive hunger, acid reflux, upset stomach, stomach aches or pains, diarrhea, or constipation.  Legs: Muscle mass and strength seem normal. There are no complaints of numbness, tingling, burning, or pain. No edema is noted.  Feet: There are no obvious foot problems. There are no complaints of numbness, tingling, burning, or pain. No edema is noted. Neurologic: There are no recognized problems with muscle movement and strength, sensation, or coordination. GYN: LMP was 2 weeks ago  Diabetes ID: charm bracelet - not wearing today. - at home   Annual Labs:  May 2019. No issues  Due TODAY   Time Basal Rate Correction factor Carb ratio Target BG   12a 0.85 77m/dL 6 150 mg/dL  4a 1.20 40 mg/dL 6 150 mg/dL  6a 1.20 40 mg/dL 6 120 mg/dL  8a 0.95 432mdL 6 120 mg/dL  9p 1.05 4045mL 6 150 mg/dL   Total Basal  23.7     Duration of insulin   3 hours        4. CGM and pump download combination:   Avg BG 185. Range  78-359   48%>180   43% in target   10% below target  Avg SG 164. Range 50-397   34% above target   64% in target   2% below target  48% basal.   Last Visit:   Avg BG 229 +/- 54. Range 141-329   >250 25%    181-249 58%   80-180 2%  avg SG 177 +/69. 44-400   >250 18%   181-249 22%    80-180 58%   71-79 1%   <70 1%   Avg basal 41% of total daily insulin  Very stable blood sugars during the day. Some higher sugars after eating and overnight.            PAST MEDICAL, FAMILY, AND SOCIAL HISTORY  Past Medical History:   Diagnosis Date  . Dehydration   . Diabetes mellitus   . Ketonuria   . Type 1 diabetes mellitus not at goal [facility])     Family History  Problem Relation Age of Onset  . Diabetes Maternal Aunt   . Heart disease Maternal Aunt   . Hypothyroidism Maternal Grandmother   . Thyroid disease Maternal Grandmother   . Heart disease Maternal Grandmother   . Diabetes Cousin   . Thyroid disease Mother      Current Outpatient Medications:  .  glucagon 1 MG injection, Inject 1.0 mg into anterior thigh one time if unconscious, unresponsive, unable to swallow and or has seizure., Disp: 2 each, Rfl: 4 .  insulin aspart (NOVOLOG) 100 UNIT/ML injection, USE 300 UNITS IN INSULIN PUMP EVERY 48 HOURS., Disp: 5 vial, Rfl: 5 .  Pediatric Multivit-Minerals-C (KIDS GUMMY BEAR VITAMINS PO), Take 1 tablet by mouth daily., Disp: , Rfl:  .  ACCU-CHEK FASTCLIX LANCETS MISC, USE AS DIRECTED 10 TIMES A DAY (Patient not taking: Reported on 10/25/2018), Disp: 306 each, Rfl: 0 .  adapalene (DIFFERIN) 0.1 % cream, APPLY TO AFFECTED AREA NIGHTLY, Disp: , Rfl:  .  Blood Glucose Monitoring Suppl (ONETOUCH VERIO FLEX SYSTEM) w/Device KIT, 1 kit by Does not apply route as needed. Use to check blood glucose (Patient not taking: Reported on 10/25/2018), Disp: 1 kit, Rfl: 6 .  cephALEXin (KEFLEX) 500 MG capsule, Take 1 capsule (500 mg total) by mouth 3 (three) times daily. (Patient not taking: Reported on 10/25/2018), Disp: 15 capsule, Rfl: 0 .  diphenhydrAMINE (BENADRYL) 12.5 MG/5ML liquid, Take 25 mg by mouth 4 (four) times daily as needed. For allergies, Disp: , Rfl:  .  Glucagon (BAQSIMI TWO PACK) 3 MG/DOSE POWD, Place 1 each into the nose as needed (severe hypoglycmia with unresponsiveness). (Patient not taking: Reported on 05/10/2018), Disp: 1 each, Rfl: 3 .  glucose blood (BAYER CONTOUR NEXT TEST) test strip, Check glucose 7x daily (Patient not taking: Reported on 05/10/2018), Disp: 250 each, Rfl: 5 .  hydrocortisone cream 1 %,  Apply 1 application topically as needed. , Disp: , Rfl:  .  insulin aspart (NOVOLOG FLEXPEN) 100 UNIT/ML FlexPen, INJECT UP TO 21 UNITS SUBCUTANEOUSLY BEFORE MEALS, BEDTIME & PER PROTOCOLS FOR HYPERGLYCEMIA & DKA (Patient not taking: Reported on 08/12/2017), Disp: 15 pen, Rfl: 4 .  Insulin Glargine (BASAGLAR KWIKPEN) 100 UNIT/ML SOPN, Use up to 50 units daily (Patient not taking: Reported on 08/12/2017), Disp: 5 pen, Rfl: 6 .  Insulin Pen Needle (BD PEN NEEDLE NANO U/F) 32G X 4 MM MISC, Use with insulin pens 6 times daily (Patient not taking: Reported on 09/02/2017), Disp: 200 each, Rfl: 3 .  ONETOUCH DELICA LANCETS 68L MISC, Use to check BG 10x day (Patient not taking: Reported on 05/10/2018), Disp: 300 each, Rfl: 4 .  ONETOUCH VERIO test strip, USE TO TEST GLUCOSE SIX TIMES A DAY. (Patient not taking: Reported on 09/02/2017), Disp: 200 each, Rfl: 5 .  phenazopyridine (PYRIDIUM) 200 MG tablet, Take 1 tablet (200 mg total) by mouth 3 (three) times daily. (Patient not taking: Reported on 10/25/2018), Disp: 6 tablet, Rfl: 0 .  Selenium Sulf-Pyrithione-Urea 2.25 % SHAM, APPLY TO AFFECTED AREA LEAVE IN PLACE 10 MINS RINSE OFF WITH WATER ONCE DAILY (Patient not taking: Reported on 10/25/2018), Disp: 180 mL, Rfl: 2 .  Throat Lozenges (HALLS JUNIORS MT), Use as directed 1 lozenge in the mouth or throat as needed. For cough, Disp: , Rfl:   Allergies as of 10/25/2018  . (No Known Allergies)     reports that she has never smoked. She has never used smokeless tobacco. She reports that she does not drink alcohol or use drugs. Pediatric History  Patient Parents  . Heckstall,Danita (Mother)   Other Topics Concern  . Not on file  Social History Narrative   Lives with Mom, [place]. Boyfriend, 1 sister.    First year at [employer] Provider: Berkley Harvey, NP  ROS: There are no other significant problems involving Arnika's other body systems.   Objective:  Vital Signs:   BP 108/68   Pulse 94   Wt 131 lb  (59.4 kg)   BMI 24.75 kg/m  Blood pressure percentiles are not available for patients who are 18 years or older.   Ht Readings from Last 3 Encounters:  09/07/18 _0  (1.549 m) (10 %, Z= -1.29)*  05/10/18 5' 1.42" (1.56 m) (13 %, Z= -1.12)*  02/08/18 5' 1.5" (1.562 m) (14 %, Z= -1.09)*   * Growth percentiles are based on CDC (Girls, 2-20 Years) data.   Wt Readings from Last 3 Encounters:  10/25/18 131 lb (59.4 kg) (55 %, Z= 0.13)*  09/07/18 136 lb (61.7 kg) (64 %, Z= 0.35)*  05/10/18 135 lb 6.4 oz (61.4 kg) (64 %, Z= 0.36)*   * Growth percentiles are based on CDC (Girls, 2-20 Years) data.   HC Readings from Last 3 Encounters:  No data found for [facility]   Body surface area is 1.6 meters squared. No height on file for this encounter. 55 %ile (Z= 0.13) based on CDC (Girls, 2-20 Years) weight-for-age data using vitals from 10/25/2018.    PHYSICAL EXAM:  Constitutional: The patient appears healthy and well nourished. The patient's height and weight are normal for age. Weight is -5 pounds since last visit  Head: The head is normocephalic. Face: The face appears normal. There are no obvious dysmorphic features. Eyes: The eyes appear to be normally formed and spaced. Gaze is conjugate. There is no obvious arcus or proptosis. Moisture appears normal. Ears: The ears are normally placed and appear externally normal. Mouth: The oropharynx and tongue appear normal. Dentition appears to be normal for age. Oral moisture is normal. Neck: The neck appears to be visibly normal. The thyroid gland is normal for age The consistency of the thyroid gland is firm.. The thyroid gland is not tender to palpation. Lungs: The lungs are clear to auscultation. Air movement is good. Heart: Heart rate and rhythm are regular. Heart sounds S1 and S2 are normal. I did not appreciate any pathologic cardiac murmurs. Abdomen: The abdomen appears to be normal in size for the patient's age. Bowel  sounds are normal. There is  no obvious hepatomegaly, splenomegaly, or other mass effect.  Arms: Muscle size and bulk are normal for age. Hands: There is no obvious tremor. Phalangeal and metacarpophalangeal joints are normal. Palmar muscles are normal for age. Palmar skin is normal. Palmar moisture is also normal. Legs: Muscles appear normal for age. No edema is present. Feet: Feet are normally formed. Neurologic: Strength is normal for age in both the upper and lower extremities. Muscle tone is normal. Sensation to touch is normal in both the legs and feet.   Back - some right side back pain- non tender.   LAB DATA:   Results for orders placed or performed in visit on 10/25/18 (from the past 504 hour(s))  POCT Glucose (Device for Home Use)   Collection Time: 10/25/18  2:49 PM  Result Value Ref Range   Glucose Fasting, POC     POC Glucose 274 (A) 70 - 99 mg/dl  POCT glycosylated hemoglobin (Hb A1C)   Collection Time: 10/25/18  2:49 PM  Result Value Ref Range   Hemoglobin A1C 7.4 (A) 4.0 - 5.6 %   HbA1c POC (<> result, manual entry)     HbA1c, POC (prediabetic range)     HbA1c, POC (controlled diabetic range)     Last A1C 8% 05/10/18   Last A1C 9%    Assessment and Plan:   ASSESSMENT: Mylea is an 18 y.o. AA female with type 1 diabetes managed on insulin pump  1. Type 1 diabetes on insulin pump:  - POC A1C as above - POC BG as above - Glucose control is overall improved - decrease in A1C since last visit - Glycemic control is variable at night (exercise dependant) and tends to spike after meals - she has had few hypoglycemic episodes  2. Hypoglycemia: rare- Control IQ is preventing most hypoglycemic episodes 3. Goiter: Her thyroid gland is stable today.    PLAN:  1. Diagnostic: A1C and BG as above. Continue home monitoring. Annual labs today 2. Therapeutic:  Discussed changes since last visit. Insulin pump settings:   Time Basal Rate Correction factor Carb ratio Target BG   12a 0.85 28m/dL 6  150 mg/dL  4a 1.20 40 mg/dL 6 150 mg/dL  6a 1.20 40 mg/dL 6 120 mg/dL  8a 0.95 475mdL 6 120 mg/dL  9p 1.05 4017mL 6 150 mg/dL   Total Basal  23.7     Duration of insulin   3 hours        3. ..PMarland Kitchentient education:  Reviewed pumPrimary school teacherNo change to pump settings. Discussed use of temp basal and the difference between when she boluses for high sugars and when she allows the Control IQ to bolus for her 4. Follow-up: Return in about 3 months (around 01/24/2019).     Level of Service: This visit lasted in excess of 25 minutes. More than 50% of the visit was devoted to counseling.  When a patient is on insulin, intensive monitoring of blood glucose levels is necessary to avoid hyperglycemia and hypoglycemia. Severe hyperglycemia/hypoglycemia can lead to hospital admissions and be life threatening.    JenLelon HuhD

## 2018-10-26 LAB — URINALYSIS, ROUTINE W REFLEX MICROSCOPIC
Bilirubin Urine: NEGATIVE
Hgb urine dipstick: NEGATIVE
Ketones, ur: NEGATIVE
Leukocytes,Ua: NEGATIVE
Nitrite: NEGATIVE
Protein, ur: NEGATIVE
Specific Gravity, Urine: 1.025 (ref 1.001–1.03)
pH: 5.5 (ref 5.0–8.0)

## 2018-10-26 LAB — COMPREHENSIVE METABOLIC PANEL
AG Ratio: 1.4 (calc) (ref 1.0–2.5)
ALT: 6 U/L (ref 5–32)
AST: 11 U/L — ABNORMAL LOW (ref 12–32)
Albumin: 4.3 g/dL (ref 3.6–5.1)
Alkaline phosphatase (APISO): 91 U/L (ref 36–128)
BUN: 10 mg/dL (ref 7–20)
CO2: 25 mmol/L (ref 20–32)
Calcium: 9.4 mg/dL (ref 8.9–10.4)
Chloride: 105 mmol/L (ref 98–110)
Creat: 0.82 mg/dL (ref 0.50–1.00)
Globulin: 3.1 g/dL (calc) (ref 2.0–3.8)
Glucose, Bld: 157 mg/dL — ABNORMAL HIGH (ref 65–139)
Potassium: 4.3 mmol/L (ref 3.8–5.1)
Sodium: 139 mmol/L (ref 135–146)
Total Bilirubin: 0.3 mg/dL (ref 0.2–1.1)
Total Protein: 7.4 g/dL (ref 6.3–8.2)

## 2018-10-26 LAB — LIPID PANEL
Cholesterol: 123 mg/dL (ref <170)
HDL: 60 mg/dL (ref >45)
LDL Cholesterol (Calc): 52 mg/dL (calc) (ref <110)
Non-HDL Cholesterol (Calc): 63 mg/dL (calc) (ref <120)
Total CHOL/HDL Ratio: 2.1 (calc) (ref <5.0)
Triglycerides: 38 mg/dL (ref <90)

## 2018-10-26 LAB — MICROALBUMIN / CREATININE URINE RATIO
Creatinine, Urine: 143 mg/dL (ref 20–275)
Microalb Creat Ratio: 5 mcg/mg creat (ref <30)
Microalb, Ur: 0.7 mg/dL

## 2018-10-26 LAB — CBC WITH DIFFERENTIAL/PLATELET
Absolute Monocytes: 435 cells/uL (ref 200–950)
Basophils Absolute: 16 cells/uL (ref 0–200)
Basophils Relative: 0.2 %
Eosinophils Absolute: 98 cells/uL (ref 15–500)
Eosinophils Relative: 1.2 %
HCT: 32.8 % — ABNORMAL LOW (ref 35.0–45.0)
Hemoglobin: 10.1 g/dL — ABNORMAL LOW (ref 11.7–15.5)
Lymphs Abs: 2657 cells/uL (ref 850–3900)
MCH: 23.7 pg — ABNORMAL LOW (ref 27.0–33.0)
MCHC: 30.8 g/dL — ABNORMAL LOW (ref 32.0–36.0)
MCV: 76.8 fL — ABNORMAL LOW (ref 80.0–100.0)
MPV: 12.8 fL — ABNORMAL HIGH (ref 7.5–12.5)
Monocytes Relative: 5.3 %
Neutro Abs: 4994 cells/uL (ref 1500–7800)
Neutrophils Relative %: 60.9 %
Platelets: 320 10*3/uL (ref 140–400)
RBC: 4.27 10*6/uL (ref 3.80–5.10)
RDW: 13.8 % (ref 11.0–15.0)
Total Lymphocyte: 32.4 %
WBC: 8.2 10*3/uL (ref 3.8–10.8)

## 2018-10-26 LAB — T4, FREE: Free T4: 1 ng/dL (ref 0.8–1.4)

## 2018-10-26 LAB — TSH: TSH: 1.28 mIU/L

## 2018-11-23 ENCOUNTER — Other Ambulatory Visit (INDEPENDENT_AMBULATORY_CARE_PROVIDER_SITE_OTHER): Payer: Self-pay | Admitting: Pediatric Endocrinology

## 2019-01-30 ENCOUNTER — Other Ambulatory Visit: Payer: Self-pay

## 2019-01-30 ENCOUNTER — Ambulatory Visit (INDEPENDENT_AMBULATORY_CARE_PROVIDER_SITE_OTHER): Payer: BC Managed Care – PPO | Admitting: Pediatric Endocrinology

## 2019-01-30 VITALS — BP 110/58 | HR 82 | Ht 60.63 in | Wt 122.6 lb

## 2019-01-30 DIAGNOSIS — Z4681 Encounter for fitting and adjustment of insulin pump: Secondary | ICD-10-CM | POA: Diagnosis not present

## 2019-01-30 DIAGNOSIS — E11649 Type 2 diabetes mellitus with hypoglycemia without coma: Secondary | ICD-10-CM

## 2019-01-30 DIAGNOSIS — R634 Abnormal weight loss: Secondary | ICD-10-CM | POA: Diagnosis not present

## 2019-01-30 LAB — POCT GLYCOSYLATED HEMOGLOBIN (HGB A1C): Hemoglobin A1C: 7.7 % — AB (ref 4.0–5.6)

## 2019-01-30 LAB — POCT GLUCOSE (DEVICE FOR HOME USE): POC Glucose: 132 mg/dl — AB (ref 70–99)

## 2019-01-30 NOTE — Patient Instructions (Addendum)
Change carb ratio from 1:6 to 1:5 to get more insulin for your carbs.   The pump is bolusing you between meals- this is either because your carb ratio is not correct or that you are not entering all the carbs that you are eating. Your sugars are really stable overnight.   Eat about 150-180 grams of carb PER DAY

## 2019-01-30 NOTE — Progress Notes (Signed)
Subjective:  Patient Name: Alisha Church Date of Birth: 06/28/2000  MRN: [id number]  Alisha Church  presents to the office today for follow-up evaluation and management of her type 1 diabetes, growth delay, weight loss, and goiter  HISTORY OF PRESENT ILLNESS:   Alisha Church is a 19 y.o. African-American young lady.   Alisha Church was accompanied by her mom  1. Alisha [facility] was admitted to [facility] pediatric [facility] on 07/19/2010 for new-onset type 1 diabetes mellitus, dehydration, and ketonuria. She had problems with polyuria, polydipsia, and thirst for several weeks prior to admission. She was initially quite dehydrated. Her blood glucose was 450. Her venous pH was 7.39. Her serum bicarbonate was 21. Her urine glucose was greater than 1000. Urine ketones were greater than 40. Hemoglobin A1c was 13.2%. Her insulin C-peptide was 0.52 (normal 0.80 to 3.9). She was started on Lantus insulin as a basal insulin. She was also started on Novolog aspart insulin as her bolus insulin at mealtimes, at bedtime and at 2 AM as needed. She was transitioned to a Medtronic 530G insulin pump 07/25/2012.  2. The patient's last [facility] visit was on 10/25/2018. In the interim, she has been generally healthy.   She [facility] not been in the [facility] or ER.   She has Control IQ on her pump. She has not had any hypoglycemia. She goes skating on Sundays. She is using a temp basal to prevent hypoglycemia after skating instead of using Control IQ.   She feels that she is doing a better job of carb counting. She is putting in 70 grams of carb per day.  She is bolusing before eating.    She is at [employer] for [employer], sport and exercise. ([employer]). 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   3. Pertinent Review of Systems:  Constitutional: The patient feels "good". The patient seems healthy and active. Eyes: Vision seems to be good with her glasses. There are no recognized eye problems. Her last eye exam was June 2019- got a  change in her rx.  Neck: The patient has no complaints of anterior neck swelling, soreness, tenderness, pressure, discomfort, or difficulty swallowing.   Heart: Heart rate increases with exercise or other physical activity. The patient has no complaints of palpitations, irregular heart beats, chest pain, or chest pressure.   Lungs: No asthma or wheezing.,  Gastrointestinal: Bowel movents seem normal. The patient has no complaints of excessive hunger, acid reflux, upset stomach, stomach aches or pains, diarrhea, or constipation.  Legs: Muscle mass and strength seem normal. There are no complaints of numbness, tingling, burning, or pain. No edema is noted.  Feet: There are no obvious foot problems. There are no complaints of numbness, tingling, burning, or pain. No edema is noted. Neurologic: There are no recognized problems with muscle movement and strength, sensation, or coordination. GYN: LMP was 12/16  Diabetes ID: charm bracelet - not wearing today. - need a new one   Annual Labs:  Sept 2020. No issues      Time Basal Rate Correction factor Carb ratio Target BG   12a 0.85 40mg /dL 6 150 mg/dL  4a 1.20 40 mg/dL 6 150 mg/dL  6a 1.20 40 mg/dL 6 120 mg/dL  8a 0.95 40mg /dL 6 120 mg/dL  9p 1.05 40mg /dL 6 150 mg/dL   Total Basal  23.7     Duration of insulin   3 hours        4. CGM and pump download combination:  Last visit:   Avg BG 185. Range 78-359   48%>180   43% in target   10% below target  Avg SG 164. Range 50-397   34% above target   64% in target   2% below target  48% basal.            PAST MEDICAL, FAMILY, AND SOCIAL HISTORY  Past Medical History:  Diagnosis Date  . Dehydration   . Diabetes mellitus   . Ketonuria   . Type 1 diabetes mellitus not at goal [facility])     Family History  Problem Relation Age of Onset  . Diabetes Maternal Aunt   . Heart disease Maternal Aunt   . Hypothyroidism Maternal Grandmother   . Thyroid disease Maternal  Grandmother   . Heart disease Maternal Grandmother   . Diabetes Cousin   . Thyroid disease Mother      Current Outpatient Medications:  .  adapalene (DIFFERIN) 0.1 % cream, APPLY TO AFFECTED AREA NIGHTLY, Disp: , Rfl:  .  insulin aspart (NOVOLOG) 100 UNIT/ML injection, INJECT 300 UNITS INTO PUMP EVERY 48 HOURS AS DIRECTED, Disp: 30 mL, Rfl: 3 .  Pediatric Multivit-Minerals-C (KIDS GUMMY BEAR VITAMINS PO), Take 1 tablet by mouth daily., Disp: , Rfl:  .  diphenhydrAMINE (BENADRYL) 12.5 MG/5ML liquid, Take 25 mg by mouth 4 (four) times daily as needed. For allergies, Disp: , Rfl:  .  Glucagon (BAQSIMI TWO PACK) 3 MG/DOSE POWD, Place 1 each into the nose as needed (severe hypoglycmia with unresponsiveness). (Patient not taking: Reported on 05/10/2018), Disp: 1 each, Rfl: 3 .  glucagon 1 MG injection, Inject 1.0 mg into anterior thigh one time if unconscious, unresponsive, unable to swallow and or has seizure. (Patient not taking: Reported on 01/30/2019), Disp: 2 each, Rfl: 4 .  hydrocortisone cream 1 %, Apply 1 application topically as needed. , Disp: , Rfl:   Allergies as of 01/30/2019  . (No Known Allergies)     reports that she has never smoked. She has never used smokeless tobacco. She reports that she does not drink alcohol or use drugs. Pediatric History  Patient Parents  . [facility] (Mother)   Other Topics Concern  . Not on file  Social History Narrative   Lives with Mom, [facility]. Boyfriend, 1 sister.    First year at [employer]  Primary Care Provider: Iona Hansen, NP  ROS: There are no other significant problems involving [facility]'s other body systems.   Objective:  Vital Signs:    BP (!) 110/58   Pulse 82   Ht 5' 0.63" (1.54 m)   Wt 122 lb 9.6 oz (55.6 kg)   LMP 01/10/2019 (Approximate)   BMI 23.45 kg/m  Growth percentile SmartLinks can only be used for patients less than 71 years old.   Ht Readings from Last 3 Encounters:  01/30/19 5' 0.63" (1.54 m)  09/07/18 5\' 1"   (1.549 m) (10 %, Z= -1.29)*  05/10/18 5' 1.42" (1.56 m) (13 %, Z= -1.12)*   * Growth percentiles are based on CDC (Girls, 2-20 Years) data.   Wt Readings from Last 3 Encounters:  01/30/19 122 lb 9.6 oz (55.6 kg)  10/25/18 131 lb (59.4 kg) (55 %, Z= 0.13)*  09/07/18 136 lb (61.7 kg) (64 %, Z= 0.35)*   * Growth percentiles are based on CDC (Girls, 2-20 Years) data.   HC Readings from Last 3 Encounters:  No data found for [facility]   Body surface area is 1.54 meters squared. Facility age  limit for growth percentiles is 20 years. Facility age limit for growth percentiles is 20 years.    PHYSICAL EXAM:   Constitutional: The patient appears healthy and well nourished. The patient's height and weight are normal for age. Continued weight loss Head: The head is normocephalic. Face: The face appears normal. There are no obvious dysmorphic features. Eyes: The eyes appear to be normally formed and spaced. Gaze is conjugate. There is no obvious arcus or proptosis. Moisture appears normal. Ears: The ears are normally placed and appear externally normal. Mouth: The oropharynx and tongue appear normal. Dentition appears to be normal for age. Oral moisture is normal. Neck: The neck appears to be visibly normal. The thyroid gland is normal for age The consistency of the thyroid gland is firm.. The thyroid gland is not tender to palpation. Lungs: No increased work of breathing Heart: normal pulses and peripheral perfusion  Abdomen: The abdomen appears to be normal in size for the patient's age. Bowel sounds are normal. There is no obvious hepatomegaly, splenomegaly, or other mass effect.  Arms: Muscle size and bulk are normal for age. Hands: There is no obvious tremor. Phalangeal and metacarpophalangeal joints are normal. Palmar muscles are normal for age. Palmar skin is normal. Palmar moisture is also normal. Legs: Muscles appear normal for age. No edema is present. Feet: Feet are normally  formed. Neurologic: Strength is normal for age in both the upper and lower extremities. Muscle tone is normal. Sensation to touch is normal in both the legs and feet.   Back - some right side back pain- non tender.   LAB DATA:   Lab Results  Component Value Date   HGBA1C 7.7 (A) 01/30/2019   HGBA1C 7.4 (A) 10/25/2018   HGBA1C 8.0 (A) 05/10/2018     No results found for this or any previous visit (from the past 504 hour(s)).    Assessment and Plan:   ASSESSMENT: Philicia is an 19 y.o. AA female with type 1 diabetes managed on insulin pump  1. Type 1 diabetes on insulin pump:  - POC A1C as above - POC BG as above - Glucose control is overall improved - decrease in A1C since last visit- approaching ADA goal of 7% - Glycemic control tends to spike after meals - she has had few hypoglycemic episodes - Increase carb ratio from 6 to 5 - Using Control IQ which is bolusing her between meals. Discussed being careful about carb counting and increased carb coverage.   2. Hypoglycemia: rare- Control IQ is preventing most hypoglycemic episodes 3. Goiter: Her thyroid gland is stable today.    PLAN:   1. Diagnostic: A1C and BG as above. Continue home monitoring. Annual labs today 2. Therapeutic:  Discussed changes since last visit. Insulin pump settings:   Time Basal Rate Correction factor Carb ratio Target BG   12a 0.85 40mg /dL 6 ->5 mg/dL  4a 696 40 mg/dL 6 -> 5 2.95 mg/dL  6a 284 40 mg/dL 6-> 5 1.32 mg/dL  8a 440 40mg /dL 6-. 5 1.02 mg/dL  9p 40mg /dL 6-> 5 725 mg/dL   Total Basal  3.66     Duration of insulin   3 hours        3. .Patient education:  Reviewed 440.  Pump changes as above. . Discussed use of temp basal and the difference between when she boluses for high sugars and when she allows the Control IQ to bolus for her 4. Follow-up: Return in about 3 months (  around 04/30/2019).     >40 minutes spent today reviewing the medical chart,  counseling the patient/family, and documenting today's encounter.   When a patient is on insulin, intensive monitoring of blood glucose levels is necessary to avoid hyperglycemia and hypoglycemia. Severe hyperglycemia/hypoglycemia can lead to [facility] admissions and be life threatening.    Dessa Phi, MD

## 2019-05-01 ENCOUNTER — Ambulatory Visit (INDEPENDENT_AMBULATORY_CARE_PROVIDER_SITE_OTHER): Payer: BC Managed Care – PPO | Admitting: Pediatric Endocrinology

## 2019-05-17 ENCOUNTER — Other Ambulatory Visit (INDEPENDENT_AMBULATORY_CARE_PROVIDER_SITE_OTHER): Payer: Self-pay | Admitting: Pediatric Endocrinology

## 2019-06-01 ENCOUNTER — Encounter (INDEPENDENT_AMBULATORY_CARE_PROVIDER_SITE_OTHER): Payer: Self-pay | Admitting: Pediatric Endocrinology

## 2019-06-01 ENCOUNTER — Other Ambulatory Visit: Payer: Self-pay

## 2019-06-01 ENCOUNTER — Ambulatory Visit (INDEPENDENT_AMBULATORY_CARE_PROVIDER_SITE_OTHER): Payer: BC Managed Care – PPO | Admitting: Pediatric Endocrinology

## 2019-06-01 VITALS — BP 112/60 | HR 72 | Wt 120.0 lb

## 2019-06-01 DIAGNOSIS — E10649 Type 1 diabetes mellitus with hypoglycemia without coma: Secondary | ICD-10-CM | POA: Diagnosis not present

## 2019-06-01 DIAGNOSIS — Z4681 Encounter for fitting and adjustment of insulin pump: Secondary | ICD-10-CM

## 2019-06-01 DIAGNOSIS — E11649 Type 2 diabetes mellitus with hypoglycemia without coma: Secondary | ICD-10-CM

## 2019-06-01 LAB — POCT GLUCOSE (DEVICE FOR HOME USE): Glucose Fasting, POC: 102 mg/dL — AB (ref 70–99)

## 2019-06-01 LAB — POCT GLYCOSYLATED HEMOGLOBIN (HGB A1C): Hemoglobin A1C: 8.5 % — AB (ref 4.0–5.6)

## 2019-06-01 NOTE — Patient Instructions (Addendum)
  Time Basal Rate Correction factor Carb ratio Target BG   12a 0.85 -> 0.95 40mg /dL 5 mg/dL  4a 818 1.3 40 mg/dL 5 5.90-> mg/dL  6a 931 -> 1.3 40 mg/dL 5 1.21 mg/dL  8a 624 -> 4.69 40mg /dL 5 5.07 mg/dL  1p -> 225 40 mg/dl 5 7.50 mg/dL  6p 5.18 335 40mg /dL 5 8.25-> mg/dL   Total Basal  1.89 New 26.9    Duration of insulin   3 hours

## 2019-06-01 NOTE — Progress Notes (Signed)
Subjective:  Patient Name: Alisha Church Date of Birth: 2000/12/18  MRN: [id number]  Alisha Church  presents to the office today for follow-up evaluation and management of her type 1 diabetes, growth delay, weight loss, and goiter  HISTORY OF PRESENT ILLNESS:   Alisha Church is a 19 y.o. African-American young lady.   Larsen was seen by herself  1. Alisha Church was admitted to [facility]'s pediatric ward on 07/19/2010 for new-onset type 1 diabetes mellitus, dehydration, and ketonuria. She had problems with polyuria, polydipsia, and thirst for several weeks prior to admission. She was initially quite dehydrated. Her blood glucose was 450. Her venous pH was 7.39. Her serum bicarbonate was 21. Her urine glucose was greater than 1000. Urine ketones were greater than 40. Hemoglobin A1c was 13.2%. Her insulin C-peptide was 0.52 (normal 0.80 to 3.9). She was started on Lantus insulin as a basal insulin. She was also started on Novolog aspart insulin as her bolus insulin at mealtimes, at bedtime and at 2 AM as needed. She was transitioned to a Medtronic 530G insulin pump 07/25/2012. She later transitioned to Tandem T-Slim/Dexcom with Control IQ.   2. The patient's last P[facility] visit was on 01/30/19. In the interim, she has been generally healthy.   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 She was offered at job during her [employer] and will be starting work in 2 weeks.   No hospital or ER visits since last visit.   She has had a "love hate" relationship with her pump/sensor. She had issues with her cartridges and with her sensor- she had to go to pen for a few days- but she is back on track with her system.   She likes being back on control IQ. She did have a low blood sugar at work when she was using pens for her insulin- but not on her T-Slim.   She has not been in the hospital or ER.   She goes skating on Sundays. She is using a temp basal to prevent hypoglycemia after skating instead of  using Control IQ if she will do a short skate- if she is going longer than she does use Control IQ. She doesn't really get low when she skates.   She feels that she is doing a better job of carb counting. She is bolusing before eating.    3. Pertinent Review of Systems:  Constitutional: The patient feels "good". The patient seems healthy and active. Eyes: Vision seems to be good with her glasses/contacts. There are no recognized eye problems. Her last eye exam was December 2020- got a change in her rx.  Neck: The patient has no complaints of anterior neck swelling, soreness, tenderness, pressure, discomfort, or difficulty swallowing.   Heart: Heart rate increases with exercise or other physical activity. The patient has no complaints of palpitations, irregular heart beats, chest pain, or chest pressure.   Lungs: No asthma or wheezing.,  Gastrointestinal: Bowel movents seem normal. The patient has no complaints of excessive hunger, acid reflux, upset stomach, stomach aches or pains, diarrhea, or constipation.  Legs: Muscle mass and strength seem normal. There are no complaints of numbness, tingling, burning, or pain. No edema is noted.  Feet: There are no obvious foot problems. There are no complaints of numbness, tingling, burning, or pain. No edema is noted. Neurologic: There are no recognized problems with muscle movement and strength, sensation, or coordination. GYN: LMP was 06/01/19  Diabetes ID: charm bracelet - not wearing today. - need a  new one   Annual Labs:  Sept 2020. No issues        4. CGM and pump download combination:        Last visit:   Avg BG 185. Range 78-359   48%>180   43% in target   10% below target  Avg SG 164. Range 50-397   34% above target   64% in target   2% below target  48% basal.            PAST MEDICAL, FAMILY, AND SOCIAL HISTORY  Past Medical History:  Diagnosis Date  . Dehydration   . Diabetes mellitus   . Ketonuria   . Type 1  diabetes mellitus not at goal [facility])     Family History  Problem Relation Age of Onset  . Diabetes Maternal Aunt   . Heart disease Maternal Aunt   . Hypothyroidism Maternal Grandmother   . Thyroid disease Maternal Grandmother   . Heart disease Maternal Grandmother   . Diabetes Cousin   . Thyroid disease Mother      Current Outpatient Medications:  .  adapalene (DIFFERIN) 0.1 % cream, APPLY TO AFFECTED AREA NIGHTLY, Disp: , Rfl:  .  clindamycin (CLEOCIN T) 1 % external solution, APPLY TO ACNE EVERY DAY, Disp: , Rfl:  .  glucose blood (ONETOUCH VERIO) test strip, CHECK GLUCOSE 6 TIMES DAILY, Disp: , Rfl:  .  insulin aspart (NOVOLOG) 100 UNIT/ML injection, INJECT 300 UNITS INTO PUMP EVERY 48 HOURS AS DIRECTED, Disp: 120 mL, Rfl: 1 .  Multiple Vitamins-Minerals (WOMENS MULTIVITAMIN PO), Take by mouth., Disp: , Rfl:  .  OneTouch Delica Lancets 33G MISC, Use to check BG 10x day, Disp: , Rfl:  .  tretinoin (RETIN-A) 0.025 % cream, APPLY TO FACE ONCE A DAY IN THE EVENING, Disp: , Rfl:  .  diphenhydrAMINE (BENADRYL) 12.5 MG/5ML liquid, Take 25 mg by mouth 4 (four) times daily as needed. For allergies, Disp: , Rfl:  .  Glucagon (BAQSIMI TWO PACK) 3 MG/DOSE POWD, Place 1 each into the nose as needed (severe hypoglycmia with unresponsiveness). (Patient not taking: Reported on 05/10/2018), Disp: 1 each, Rfl: 3 .  glucagon 1 MG injection, Inject 1.0 mg into anterior thigh one time if unconscious, unresponsive, unable to swallow and or has seizure. (Patient not taking: Reported on 01/30/2019), Disp: 2 each, Rfl: 4 .  hydrocortisone cream 1 %, Apply 1 application topically as needed. , Disp: , Rfl:  .  Pediatric Multivit-Minerals-C (KIDS GUMMY BEAR VITAMINS PO), Take 1 tablet by mouth daily., Disp: , Rfl:   Allergies as of 06/01/2019  . (No Known Allergies)     reports that she has never smoked. She has never used smokeless tobacco. She reports that she does not drink alcohol or use drugs. Pediatric  History  Patient Parents  . Heckstall,Danita (Mother)   Other Topics Concern  . Not on file  Social History Narrative   Lives with Mom, [name]. Boyfriend, 1 sister.    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   Objective:  Vital Signs:    BP 112/60   Pulse 72   Wt 120 lb (54.4 kg)   LMP 06/01/2019   BMI 22.95 kg/m  Growth percentile SmartLinks can only be used for patients less than 31 years old.   Ht Readings from Last 3 Encounters:  01/30/19 5'  0.63" (1.54 m)  09/07/18 5\' 1"  (1.549 m) (10 %, Z= -1.29)*  05/10/18 5' 1.42" (1.56 m) (13 %, Z= -1.12)*   * Growth percentiles are based on CDC (Girls, 2-20 Years) data.   Wt Readings from Last 3 Encounters:  06/01/19 120 lb (54.4 kg)  01/30/19 122 lb 9.6 oz (55.6 kg)  10/25/18 131 lb (59.4 kg) (55 %, Z= 0.13)*   * Growth percentiles are based on CDC (Girls, 2-20 Years) data.   HC Readings from Last 3 Encounters:  No data found for [facility]   Body surface area is 1.53 meters squared. Facility age limit for growth percentiles is 20 years. Facility age limit for growth percentiles is 20 years.   PHYSICAL EXAM:   Constitutional: The patient appears healthy and well nourished. The patient's height and weight are normal for age. Continued weight loss Head: The head is normocephalic. Face: The face appears normal. There are no obvious dysmorphic features. Eyes: The eyes appear to be normally formed and spaced. Gaze is conjugate. There is no obvious arcus or proptosis. Moisture appears normal. Ears: The ears are normally placed and appear externally normal. Mouth: The oropharynx and tongue appear normal. Dentition appears to be normal for age. Oral moisture is normal. Neck: The neck appears to be visibly normal. The thyroid gland is normal for age The consistency of the thyroid gland is firm.. The  thyroid gland is not tender to palpation. Lungs: No increased work of breathing Heart: normal pulses and peripheral perfusion  Abdomen: The abdomen appears to be normal in size for the patient's age. There is no obvious hepatomegaly, splenomegaly, or other mass effect.  Arms: Muscle size and bulk are normal for age. Hands: There is no obvious tremor. Phalangeal and metacarpophalangeal joints are normal. Palmar muscles are normal for age. Palmar skin is normal. Palmar moisture is also normal. Legs: Muscles appear normal for age. No edema is present. Feet: Feet are normally formed. Neurologic: Strength is normal for age in both the upper and lower extremities. Muscle tone is normal. Sensation to touch is normal in both the legs and feet.    *  LAB DATA:   Lab Results  Component Value Date   HGBA1C 8.5 (A) 06/01/2019   HGBA1C 7.7 (A) 01/30/2019   HGBA1C 7.4 (A) 10/25/2018     Results for orders placed or performed in visit on 06/01/19 (from the past 504 hour(s))  POCT Glucose (Device for Home Use)   Collection Time: 06/01/19  2:20 PM  Result Value Ref Range   Glucose Fasting, POC 102 (A) 70 - 99 mg/dL   POC Glucose    POCT glycosylated hemoglobin (Hb A1C)   Collection Time: 06/01/19  2:31 PM  Result Value Ref Range   Hemoglobin A1C 8.5 (A) 4.0 - 5.6 %   HbA1c POC (<> result, manual entry)     HbA1c, POC (prediabetic range)     HbA1c, POC (controlled diabetic range)        Assessment and Plan:   ASSESSMENT: Kynzie is an 19 y.o. AA female with type 1 diabetes managed on insulin pump   1. Type 1 diabetes on insulin pump:  - POC A1C as above - POC BG as above - Glucose control is not as good as previous- she blames this on school/exam stress and issues with supplies - increase in A1C since last visit-  A1C is higher thanADA goal of 7% - Glycemic control tends to spike after meals - she has had  few hypoglycemic episodes - Increase carb ratio from 6 to 5 at last visit - Using  Control IQ which is bolusing her between meals. Reviewed being careful about carb counting and increased carb coverage.  - She is getting 4-6 units of Control IQ basal in addition to her programmed basal.   2. Hypoglycemia: rare- Control IQ is preventing most hypoglycemic episodes 3. Goiter: Her thyroid gland is stable today.    PLAN:   1. Diagnostic: A1C and BG as above. Continue home monitoring. Next visit annual labs 2. Therapeutic:  Discussed changes since last visit. Insulin pump settings:   Time Basal Rate Correction factor Carb ratio Target BG   12a 0.85 -> 0.95 40mg /dL 5 150 mg/dL  4a 1.20-> 1.3 40 mg/dL 5 150 mg/dL  6a 1.20 -> 1.3 40 mg/dL 5 120 mg/dL  8a 0.95 -> 1.05 40mg /dL 5 120 mg/dL  1p 1.05 -> 1.15 40 mg/dl 5 120 mg/dL  6p 1.05-> 1.15 40mg /dL 5 120 mg/dL   Total Basal  24.5 New 26.9    Duration of insulin   3 hours         3. Marland Kitchen.Patient education:  Reviewed Primary school teacher.  Pump changes as above. . Discussed use of temp basal and the difference between when she boluses for high sugars and when she allows the Control IQ to bolus for her. Discussed use of extended bolus. Discussed transition of care to adult endo. Will discuss further at next visit.  4. Follow-up: Return in about 3 months (around 09/01/2019).     >>40 minutes spent today reviewing the medical chart, counseling the patient/family, and documenting today's encounter. When a patient is on insulin, intensive monitoring of blood glucose levels is necessary to avoid hyperglycemia and hypoglycemia. Severe hyperglycemia/hypoglycemia can lead to hospital admissions and be life threatening.    Lelon Huh, MD

## 2019-09-13 ENCOUNTER — Encounter (INDEPENDENT_AMBULATORY_CARE_PROVIDER_SITE_OTHER): Payer: Self-pay | Admitting: Pediatric Endocrinology

## 2019-09-13 ENCOUNTER — Other Ambulatory Visit: Payer: Self-pay

## 2019-09-13 ENCOUNTER — Ambulatory Visit (INDEPENDENT_AMBULATORY_CARE_PROVIDER_SITE_OTHER): Payer: BC Managed Care – PPO | Admitting: Pediatric Endocrinology

## 2019-09-13 VITALS — BP 116/74 | HR 80 | Ht 60.67 in | Wt 123.6 lb

## 2019-09-13 DIAGNOSIS — E109 Type 1 diabetes mellitus without complications: Secondary | ICD-10-CM

## 2019-09-13 DIAGNOSIS — R7989 Other specified abnormal findings of blood chemistry: Secondary | ICD-10-CM

## 2019-09-13 LAB — POCT GLYCOSYLATED HEMOGLOBIN (HGB A1C): Hemoglobin A1C: 8.3 % — AB (ref 4.0–5.6)

## 2019-09-13 LAB — POCT GLUCOSE (DEVICE FOR HOME USE): POC Glucose: 200 mg/dl — AB (ref 70–99)

## 2019-09-13 NOTE — Patient Instructions (Addendum)
By your next visit- please have an idea where you want me to refer you for adult care. Otherwise- I will refer you to [facility].    Time Basal Rate Correction factor Carb ratio Target BG   12a 0.95 40mg /dL 5 mg/dL  4a 1.3 40 mg/dL 5 620 mg/dL  6a 1.3 40 mg/dL 5-> 7 355 mg/dL  8a 974 40mg /dL 5 1.63 mg/dL  1p 40 mg/dl 5 845 mg/dL  6p 3.64 40mg /dL 5 680 mg/dL   Total Basal  3.21     Duration of insulin   3 hours       Please do go get your Covid Vaccinations.

## 2019-09-13 NOTE — Progress Notes (Signed)
Subjective:  Patient Name: Alisha Church Date of Birth: 04-Jun-2000  MRN: [id number]  Alisha Church  presents to the office today for follow-up evaluation and management of her type 1 diabetes, growth delay, weight loss, and goiter  HISTORY OF PRESENT ILLNESS:   Alisha Church is a 19 y.o. African-American young lady.   Alisha Church was seen by herself  1. Alisha Church was admitted to [facility]'s pediatric ward on 07/19/2010 for new-onset type 1 diabetes mellitus, dehydration, and ketonuria. She had problems with polyuria, polydipsia, and thirst for several weeks prior to admission. She was initially quite dehydrated. Her blood glucose was 450. Her venous pH was 7.39. Her serum bicarbonate was 21. Her urine glucose was greater than 1000. Urine ketones were greater than 40. Hemoglobin A1c was 13.2%. Her insulin C-peptide was 0.52 (normal 0.80 to 3.9). She was started on Lantus insulin as a basal insulin. She was also started on Novolog aspart insulin as her bolus insulin at mealtimes, at bedtime and at 2 AM as needed. She was transitioned to a Medtronic 530G insulin pump 07/25/2012. She later transitioned to Tandem T-Slim/Dexcom with Control IQ.   2. The patient's last PSSG visit was on 06/01/19. In the interim, she has been generally healthy.   She has been working at [employer] for the past 3 months. 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   She feels that her diabetes management has been good. She did have issues getting supplies from [place] and used a meter for 1 week while she was between sensors for her Dexcom.   She tried to check every 3 hours when she was not using the Dexcom. She feels that her sugars were a little higher without the Control IQ but not too bad.   She put her sensor on her calf and she has really liked it there. She is in a group FB page and saw that some people were using it there.   She is still skating on Sundays. She is using a 50% temp basal.  She feels that this is more secure for her than trusting her Control IQ.   She has had some hypoglycemia at work when she is constantly on her feet- but not too much. The control IQ will help- but she eats something when her pump alarms.    3. Pertinent Review of Systems:  Constitutional: The patient feels "pretty good". The patient seems healthy and active. Eyes: Vision seems to be good with her glasses/contacts. There are no recognized eye problems. Her last eye exam was December 2020- got a change in her rx.  Neck: The patient has no complaints of anterior neck swelling, soreness, tenderness, pressure, discomfort, or difficulty swallowing.   Heart: Heart rate increases with exercise or other physical activity. The patient has no complaints of palpitations, irregular heart beats, chest pain, or chest pressure.   Lungs: No asthma or wheezing.,  Gastrointestinal: Bowel movents seem normal. The patient has no complaints of excessive hunger, acid reflux, upset stomach, stomach aches or pains, diarrhea, or constipation.  Legs: Muscle mass and strength seem normal. There are no complaints of numbness, tingling, burning, or pain. No edema is noted.  Feet: There are no obvious foot problems. There are no complaints of numbness, tingling, burning, or pain. No edema is noted. Neurologic: There are no recognized problems with muscle movement and strength, sensation, or coordination. GYN: LMP was 08/12/19 Covid: not vaccinated  Diabetes ID: charm bracelet - wearing today but  hard to read.  need a new one   Annual Labs:  Sept 2020. No issues  Due today   4. CGM and pump download combination:   avg blood glucose of 208. Range 93-318.65% above target.  avg sensor glucose of 177 Range 41-400. 41% >180, 55% 80-180. 4% <80  49% basal 28.5 u/day 27% food bolus 6% correction insulin  avg total daily insulin of 57.71 units/day                 PAST MEDICAL, FAMILY, AND SOCIAL HISTORY  Past  Medical History:  Diagnosis Date  . Dehydration   . Diabetes mellitus   . Ketonuria   . Type 1 diabetes mellitus not at goal [facility])     Family History  Problem Relation Age of Onset  . Diabetes Maternal Aunt   . Heart disease Maternal Aunt   . Hypothyroidism Maternal Grandmother   . Thyroid disease Maternal Grandmother   . Heart disease Maternal Grandmother   . Diabetes Cousin   . Thyroid disease Mother      Current Outpatient Medications:  .  adapalene (DIFFERIN) 0.1 % cream, APPLY TO AFFECTED AREA NIGHTLY, Disp: , Rfl:  .  clindamycin (CLEOCIN T) 1 % external solution, APPLY TO ACNE EVERY DAY, Disp: , Rfl:  .  diphenhydrAMINE (BENADRYL) 12.5 MG/5ML liquid, Take 25 mg by mouth 4 (four) times daily as needed. For allergies, Disp: , Rfl:  .  glucagon 1 MG injection, Inject 1.0 mg into anterior thigh one time if unconscious, unresponsive, unable to swallow and or has seizure., Disp: 2 each, Rfl: 4 .  glucose blood (ONETOUCH VERIO) test strip, CHECK GLUCOSE 6 TIMES DAILY, Disp: , Rfl:  .  hydrocortisone cream 1 %, Apply 1 application topically as needed. , Disp: , Rfl:  .  insulin aspart (NOVOLOG) 100 UNIT/ML injection, INJECT 300 UNITS INTO PUMP EVERY 48 HOURS AS DIRECTED, Disp: 120 mL, Rfl: 1 .  Multiple Vitamins-Minerals (WOMENS MULTIVITAMIN PO), Take by mouth., Disp: , Rfl:  .  OneTouch Delica Lancets 33G MISC, Use to check BG 10x day, Disp: , Rfl:  .  Pediatric Multivit-Minerals-C (KIDS GUMMY BEAR VITAMINS PO), Take 1 tablet by mouth daily., Disp: , Rfl:  .  tretinoin (RETIN-A) 0.025 % cream, APPLY TO FACE ONCE A DAY IN THE EVENING, Disp: , Rfl:  .  Glucagon (BAQSIMI TWO PACK) 3 MG/DOSE POWD, Place 1 each into the nose as needed (severe hypoglycmia with unresponsiveness). (Patient not taking: Reported on 05/10/2018), Disp: 1 each, Rfl: 3  Allergies as of 09/13/2019  . (No Known Allergies)     reports that she has never smoked. She has never used smokeless tobacco. She reports  that she does not drink alcohol and does not use drugs. Pediatric History  Patient Parents  . Heckstall,Danita (Mother)   Other Topics Concern  . Not on file  Social History Narrative   Lives with Mom, [name]. Boyfriend, 1 sister.    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   Objective:  Vital Signs:    BP 116/74   Pulse 80   Ht 5' 0.67" (1.541 m)   Wt 123 lb 9.6 oz (56.1 kg)   LMP 08/12/2019 (Exact Date)   BMI 23.61 kg/m  Growth percentile SmartLinks can only be used for patients less than 70 years old.   Ht Readings from Last 3 Encounters:  09/13/19  5' 0.67" (1.541 m)  01/30/19 5' 0.63" (1.54 m)  09/07/18 5\' 1"  (1.549 m) (10 %, Z= -1.29)*   * Growth percentiles are based on CDC (Girls, 2-20 Years) data.   Wt Readings from Last 3 Encounters:  09/13/19 123 lb 9.6 oz (56.1 kg)  06/01/19 120 lb (54.4 kg)  01/30/19 122 lb 9.6 oz (55.6 kg)   HC Readings from Last 3 Encounters:  No [facility] found for [facility]   Body surface area is 1.55 meters squared. Facility age limit for growth percentiles is 20 years. Facility age limit for growth percentiles is 20 years.   PHYSICAL EXAM:   Constitutional: The patient appears healthy and well nourished. The patient's height and weight are normal for age. Weight has stabilized Head: The head is normocephalic. Face: The face appears normal. There are no obvious dysmorphic features. Eyes: The eyes appear to be normally formed and spaced. Gaze is conjugate. There is no obvious arcus or proptosis. Moisture appears normal. Ears: The ears are normally placed and appear externally normal. Mouth: The oropharynx and tongue appear normal. Dentition appears to be normal for age. Oral moisture is normal. Neck: The neck appears to be visibly normal. The thyroid gland is normal for age The consistency of the thyroid gland is firm.. The thyroid gland is  not tender to palpation. Lungs: No increased work of breathing Heart: normal pulses and peripheral perfusion  Abdomen: The abdomen appears to be normal in size for the patient's age. There is no obvious hepatomegaly, splenomegaly, or other mass effect.  Arms: Muscle size and bulk are normal for age. Hands: There is no obvious tremor. Phalangeal and metacarpophalangeal joints are normal. Palmar muscles are normal for age. Palmar skin is normal. Palmar moisture is also normal. Legs: Muscles appear normal for age. No edema is present. Feet: Feet are normally formed. Neurologic: Strength is normal for age in both the upper and lower extremities. Muscle tone is normal. Sensation to touch is normal in both the legs and feet.    LAB DATA:   Lab Results  Component Value Date   HGBA1C 8.3 (A) 09/13/2019   HGBA1C 8.5 (A) 06/01/2019   HGBA1C 7.7 (A) 01/30/2019     Results for orders placed or performed in visit on 09/13/19 (from the past 504 hour(s))  POCT glycosylated hemoglobin (Hb A1C)   Collection Time: 09/13/19  2:30 PM  Result Value Ref Range   Hemoglobin A1C 8.3 (A) 4.0 - 5.6 %   HbA1c POC (<> result, manual entry)     HbA1c, POC (prediabetic range)     HbA1c, POC (controlled diabetic range)    POCT Glucose (Device for Home Use)   Collection Time: 09/13/19  2:31 PM  Result Value Ref Range   Glucose Fasting, POC     POC Glucose 200 (A) 70 - 99 mg/dl      Assessment and Plan:   ASSESSMENT: Audree Camelmieya is an 19 y.o. AA female with type 1 diabetes managed on insulin pump   1. Type 1 diabetes on insulin pump:  - POC A1C as above - POC BG as above - Glucose control is stable -improvement in A1C since last visit-  A1C is still higher thanADA goal of 7% - Glycemic control tends to drop after breakfast. She runs high when she eats dinner late.  - she has had few hypoglycemic episodes - Using Control IQ which is bolusing her between meals. Reviewed being careful about carb counting and  increased carb coverage.  2. Hypoglycemia: rare- Control IQ is preventing most hypoglycemic episodes 3. Goiter: Her thyroid gland is stable today.    PLAN:   1. Diagnostic: A1C and BG as above. Continue home monitoring. annual labs today 2. Therapeutic:  Discussed changes since last visit. Insulin pump settings:   Time Basal Rate Correction factor Carb ratio Target BG   12a 0.95 40mg /dL 5 mg/dL  4a 1.3 40 mg/dL 5 952 mg/dL  6a 1.3 40 mg/dL 5-> 7 841 mg/dL  8a 324 40mg /dL 5 4.01 mg/dL  1p 40 mg/dl 5 027 mg/dL  6p 2.53 40mg /dL 5 664 mg/dL   Total Basal  4.03     Duration of insulin   3 hours         3. .Patient education:  Reviewed 474.  Pump changes as above. . Discussed use of temp basal and the difference between when she boluses for high sugars and when she allows the Control IQ to bolus for her. Discussed use of extended bolus. Discussed transition of care to adult endo. Will discuss further at next visit. Ameiya to look into endocrine care at [facility].  4. Follow-up: No follow-ups on file.     >40 minutes spent today reviewing the medical chart, counseling the patient/family, and documenting today's encounter. When a patient is on insulin, intensive monitoring of blood glucose levels is necessary to avoid hyperglycemia and hypoglycemia. Severe hyperglycemia/hypoglycemia can lead to hospital admissions and be life threatening.    Marland Kitchen, MD

## 2019-09-14 LAB — CBC WITH DIFFERENTIAL/PLATELET
Absolute Monocytes: 469 cells/uL (ref 200–950)
Basophils Absolute: 33 cells/uL (ref 0–200)
Basophils Relative: 0.5 %
Eosinophils Absolute: 132 cells/uL (ref 15–500)
Eosinophils Relative: 2 %
HCT: 34.1 % — ABNORMAL LOW (ref 35.0–45.0)
Hemoglobin: 10.4 g/dL — ABNORMAL LOW (ref 11.7–15.5)
Lymphs Abs: 2336 cells/uL (ref 850–3900)
MCH: 22.5 pg — ABNORMAL LOW (ref 27.0–33.0)
MCHC: 30.5 g/dL — ABNORMAL LOW (ref 32.0–36.0)
MCV: 73.8 fL — ABNORMAL LOW (ref 80.0–100.0)
Monocytes Relative: 7.1 %
Neutro Abs: 3630 cells/uL (ref 1500–7800)
Neutrophils Relative %: 55 %
Platelets: 268 10*3/uL (ref 140–400)
RBC: 4.62 10*6/uL (ref 3.80–5.10)
RDW: 15.8 % — ABNORMAL HIGH (ref 11.0–15.0)
Total Lymphocyte: 35.4 %
WBC: 6.6 10*3/uL (ref 3.8–10.8)

## 2019-09-14 LAB — LIPID PANEL
Cholesterol: 146 mg/dL (ref <200)
HDL: 78 mg/dL (ref >=50)
LDL Cholesterol (Calc): 58 mg/dL (calc)
Non-HDL Cholesterol (Calc): 68 mg/dL (calc) (ref <130)
Total CHOL/HDL Ratio: 1.9 (calc) (ref <5.0)
Triglycerides: 35 mg/dL (ref <150)

## 2019-09-14 LAB — COMPREHENSIVE METABOLIC PANEL
AG Ratio: 1.3 (calc) (ref 1.0–2.5)
ALT: 10 U/L (ref 6–29)
AST: 12 U/L (ref 10–30)
Albumin: 4.3 g/dL (ref 3.6–5.1)
Alkaline phosphatase (APISO): 121 U/L (ref 31–125)
BUN: 12 mg/dL (ref 7–25)
CO2: 24 mmol/L (ref 20–32)
Calcium: 9.8 mg/dL (ref 8.6–10.2)
Chloride: 104 mmol/L (ref 98–110)
Creat: 0.75 mg/dL (ref 0.50–1.10)
Globulin: 3.2 g/dL (calc) (ref 1.9–3.7)
Glucose, Bld: 132 mg/dL (ref 65–139)
Potassium: 4.1 mmol/L (ref 3.5–5.3)
Sodium: 137 mmol/L (ref 135–146)
Total Bilirubin: 0.3 mg/dL (ref 0.2–1.2)
Total Protein: 7.5 g/dL (ref 6.1–8.1)

## 2019-09-14 LAB — MICROALBUMIN / CREATININE URINE RATIO
Creatinine, Urine: 126 mg/dL (ref 20–275)
Microalb Creat Ratio: 3 mcg/mg creat (ref <30)
Microalb, Ur: 0.4 mg/dL

## 2019-09-14 LAB — TSH: TSH: 1.39 mIU/L

## 2019-09-14 LAB — T4, FREE: Free T4: 0.9 ng/dL (ref 0.8–1.4)

## 2019-09-29 ENCOUNTER — Institutional Professional Consult (permissible substitution): Payer: BC Managed Care – PPO | Admitting: Plastic Surgery

## 2019-12-11 ENCOUNTER — Ambulatory Visit (INDEPENDENT_AMBULATORY_CARE_PROVIDER_SITE_OTHER): Payer: BC Managed Care – PPO | Admitting: Pediatric Endocrinology

## 2019-12-11 ENCOUNTER — Other Ambulatory Visit: Payer: Self-pay

## 2019-12-11 ENCOUNTER — Encounter (INDEPENDENT_AMBULATORY_CARE_PROVIDER_SITE_OTHER): Payer: Self-pay | Admitting: Pediatric Endocrinology

## 2019-12-11 VITALS — BP 114/70 | Wt 128.0 lb

## 2019-12-11 DIAGNOSIS — Z4681 Encounter for fitting and adjustment of insulin pump: Secondary | ICD-10-CM | POA: Diagnosis not present

## 2019-12-11 DIAGNOSIS — E109 Type 1 diabetes mellitus without complications: Secondary | ICD-10-CM | POA: Diagnosis not present

## 2019-12-11 LAB — POCT GLUCOSE (DEVICE FOR HOME USE): POC Glucose: 89 mg/dl (ref 70–99)

## 2019-12-11 LAB — POCT GLYCOSYLATED HEMOGLOBIN (HGB A1C): Hemoglobin A1C: 8.9 % — AB (ref 4.0–5.6)

## 2019-12-11 NOTE — Patient Instructions (Signed)
   Time Basal Rate Correction factor Carb ratio Target BG   12a 0.95 -> 0.9 40mg /dL 5 mg/dL  4a 1.3 -> 1.2 40 mg/dL 5 650 mg/dL  6a 354 6.5-> 40 mg/dL 5 6.81 mg/dL  8a 275 1.1 40mg /dL 5-> 4 1.70_> mg/dL  1p 40 mg/dl 5 017 mg/dL  6p 4.94 -> 1.1 40mg /dL 5 496 mg/dL   Total Basal  7.59 -> 26.5    Duration of insulin   3 hours

## 2019-12-11 NOTE — Progress Notes (Signed)
Subjective:  Patient Name: [name] Date of Birth: 06-12-2000  MRN: [id number]  Madalena Kesecker  presents to the office today for follow-up evaluation and management of her type 1 diabetes, growth delay, weight loss, and goiter  HISTORY OF PRESENT ILLNESS:   Klare is a 19 y.o. African-American young lady.   Hilari was seen by herself  1. Kameshia was admitted to [facility]'s pediatric ward on 07/19/2010 for new-onset type 1 diabetes mellitus, dehydration, and ketonuria. She had problems with polyuria, polydipsia, and thirst for several weeks prior to admission. She was initially quite dehydrated. Her blood glucose was 450. Her venous pH was 7.39. Her serum bicarbonate was 21. Her urine glucose was greater than 1000. Urine ketones were greater than 40. Hemoglobin A1c was 13.2%. Her insulin C-peptide was 0.52 (normal 0.80 to 3.9). She was started on Lantus insulin as a basal insulin. She was also started on Novolog aspart insulin as her bolus insulin at mealtimes, at bedtime and at 2 AM as needed. She was transitioned to a Medtronic 530G insulin pump 07/25/2012. She later transitioned to Tandem T-Slim/Dexcom with Control IQ.   2. The patient's last [facility] visit was on 09/13/19. In the interim, she has been generally healthy.   She has continued working at [employer]. She says that it is going well there.   She is feeling sad about switching to adult care.   She says that her diabetes management had been good. She says that it has been running high in the past month due to increased stress and anxiety. She just bought a BMW for herself (used).   No issues with getting her supplies. She has been wearing her Dexcom. She has been trying new sites for her Dexcom- but has not found them to be very effective - and she says that her Dexcom falls off more easily when she puts it in novel spots.   Some people in her FB diabetes group were putting their CGM on their calves- she liked it there  but it only lasted a few days.   She is still skating on Sundays. She is using a 50% temp basal.  She has not been getting hypoglycemia at work recently- but she has started to have some at night (60s).  She says that this is pump site dependant- some sites absorb better than others.    3. Pertinent Review of Systems:  Constitutional: The patient feels "good". The patient seems healthy and active. Eyes: Vision seems to be good with her glasses/contacts. There are no recognized eye problems. Her last eye exam was December 2020- got a change in her rx.  Neck: The patient has no complaints of anterior neck swelling, soreness, tenderness, pressure, discomfort, or difficulty swallowing.   Heart: Heart rate increases with exercise or other physical activity. The patient has no complaints of palpitations, irregular heart beats, chest pain, or chest pressure.   Lungs: No asthma or wheezing.,  Gastrointestinal: Bowel movents seem normal. The patient has no complaints of excessive hunger, acid reflux, upset stomach, stomach aches or pains, diarrhea, or constipation.  Legs: Muscle mass and strength seem normal. There are no complaints of numbness, tingling, burning, or pain. No edema is noted.  Feet: There are no obvious foot problems. There are no complaints of numbness, tingling, burning, or pain. No edema is noted. Neurologic: There are no recognized problems with muscle movement and strength, sensation, or coordination. GYN: LMP was 10/18 Covid: not vaccinated   Diabetes ID: charm  bracelet -  need a new one    Annual Labs:  Done August 2021- mild anemia- now taking iron. No other issues. Due August 2022   4. CGM and pump download combination:        Last visit: avg blood glucose of 208. Range 93-318.65% above target.  avg sensor glucose of 177 Range 41-400. 41% >180, 55% 80-180. 4% <80  49% basal 28.5 u/day 27% food bolus 6% correction insulin  avg total daily insulin of 57.71  units/day                 PAST MEDICAL, FAMILY, AND SOCIAL HISTORY  Past Medical History:  Diagnosis Date  . Dehydration   . Diabetes mellitus   . Ketonuria   . Type 1 diabetes mellitus not at goal [facility])     Family History  Problem Relation Age of Onset  . Diabetes Maternal Aunt   . Heart disease Maternal Aunt   . Hypothyroidism Maternal Grandmother   . Thyroid disease Maternal Grandmother   . Heart disease Maternal Grandmother   . Diabetes Cousin   . Thyroid disease Mother      Current Outpatient Medications:  .  clindamycin (CLEOCIN T) 1 % external solution, APPLY TO ACNE EVERY DAY, Disp: , Rfl:  .  glucagon 1 MG injection, Inject 1.0 mg into anterior thigh one time if unconscious, unresponsive, unable to swallow and or has seizure., Disp: 2 each, Rfl: 4 .  glucose blood (ONETOUCH VERIO) test strip, CHECK GLUCOSE 6 TIMES DAILY, Disp: , Rfl:  .  insulin aspart (NOVOLOG) 100 UNIT/ML injection, INJECT 300 UNITS INTO PUMP EVERY 48 HOURS AS DIRECTED, Disp: 120 mL, Rfl: 1 .  Multiple Vitamins-Minerals (WOMENS MULTIVITAMIN PO), Take by mouth., Disp: , Rfl:  .  OneTouch Delica Lancets 33G MISC, Use to check BG 10x day, Disp: , Rfl:  .  tretinoin (RETIN-A) 0.025 % cream, APPLY TO FACE ONCE A DAY IN THE EVENING, Disp: , Rfl:  .  adapalene (DIFFERIN) 0.1 % cream, APPLY TO AFFECTED AREA NIGHTLY (Patient not taking: Reported on 12/11/2019), Disp: , Rfl:  .  diphenhydrAMINE (BENADRYL) 12.5 MG/5ML liquid, Take 25 mg by mouth 4 (four) times daily as needed. For allergies (Patient not taking: Reported on 12/11/2019), Disp: , Rfl:  .  Glucagon (BAQSIMI TWO PACK) 3 MG/DOSE POWD, Place 1 each into the nose as needed (severe hypoglycmia with unresponsiveness). (Patient not taking: Reported on 05/10/2018), Disp: 1 each, Rfl: 3 .  hydrocortisone cream 1 %, Apply 1 application topically as needed.  (Patient not taking: Reported on 12/11/2019), Disp: , Rfl:  .  Pediatric Multivit-Minerals-C (KIDS  GUMMY BEAR VITAMINS PO), Take 1 tablet by mouth daily. (Patient not taking: Reported on 12/11/2019), Disp: , Rfl:   Allergies as of 12/11/2019  . (No Known Allergies)     reports that she has never smoked. She has never used smokeless tobacco. She reports that she does not drink alcohol and does not use drugs. Pediatric History  Patient Parents  . Heckstall,Danita (Mother)   Other Topics Concern  . 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    Working at [employer]: Iona Hansen, NP  ROS: There are no other significant problems involving Saphire's other body systems.   Objective:  Vital Signs:    BP 114/70  Wt 128 lb (58.1 kg)   LMP 11/13/2019 (Exact Date)   BMI 24.45 kg/m  Growth percentile SmartLinks can only be used for patients less than 21 years old.   Ht Readings from Last 3 Encounters:  09/13/19 5' 0.67" (1.541 m)  01/30/19 5' 0.63" (1.54 m)  09/07/18 5\' 1"  (1.549 m) (10 %, Z= -1.29)*   * Growth percentiles are based on CDC (Girls, 2-20 Years) data.   Wt Readings from Last 3 Encounters:  12/11/19 128 lb (58.1 kg)  09/13/19 123 lb 9.6 oz (56.1 kg)  06/01/19 120 lb (54.4 kg)   HC Readings from Last 3 Encounters:  No data found for [facility]   Body surface area is 1.58 meters squared. Facility age limit for growth percentiles is 20 years. Facility age limit for growth percentiles is 20 years.   PHYSICAL EXAM:   Constitutional: The patient appears healthy and well nourished. The patient's height and weight are normal for age. Weight has increased some.  Head: The head is normocephalic. Face: The face appears normal. There are no obvious dysmorphic features. Eyes: The eyes appear to be normally formed and spaced. Gaze is conjugate. There is no obvious arcus or proptosis. Moisture appears normal. Ears: The ears  are normally placed and appear externally normal. Mouth: The oropharynx and tongue appear normal. Dentition appears to be normal for age. Oral moisture is normal. Neck: The neck appears to be visibly normal. The thyroid gland is normal for age The consistency of the thyroid gland is firm.. The thyroid gland is not tender to palpation. Lungs: No increased work of breathing Heart: normal pulses and peripheral perfusion  Abdomen: The abdomen appears to be normal in size for the patient's age. There is no obvious hepatomegaly, splenomegaly, or other mass effect.  Arms: Muscle size and bulk are normal for age. Hands: There is no obvious tremor. Phalangeal and metacarpophalangeal joints are normal. Palmar muscles are normal for age. Palmar skin is normal. Palmar moisture is also normal. Legs: Muscles appear normal for age. No edema is present. Feet: Feet are normally formed. Neurologic: Strength is normal for age in both the upper and lower extremities. Muscle tone is normal. Sensation to touch is normal in both the legs and feet.    LAB DATA:   Lab Results  Component Value Date   HGBA1C 8.9 (A) 12/11/2019   HGBA1C 8.3 (A) 09/13/2019   HGBA1C 8.5 (A) 06/01/2019     Results for orders placed or performed in visit on 12/11/19 (from the past 504 hour(s))  POCT Glucose (Device for Home Use)   Collection Time: 12/11/19 10:00 AM  Result Value Ref Range   Glucose Fasting, POC     POC Glucose 89 70 - 99 mg/dl  POCT glycosylated hemoglobin (Hb A1C)   Collection Time: 12/11/19 10:00 AM  Result Value Ref Range   Hemoglobin A1C 8.9 (A) 4.0 - 5.6 %   HbA1c POC (<> result, manual entry)     HbA1c, POC (prediabetic range)     HbA1c, POC (controlled diabetic range)        Assessment and Plan:   ASSESSMENT: Audree Camelmieya is an 19 y.o. AA female with type 1 diabetes managed on insulin pump   1. Type 1 diabetes on insulin pump:  - POC A1C as above - POC BG as above - Glucose control is stable - A1C is  still higher thanADA goal of 7% - Glycemic control tends to increase after breakfast and lunch - she is  dropping low overnight - Using Control IQ which is bolusing her between meals. Reviewed being careful about carb counting and increased carb coverage.   2. Hypoglycemia: mostly overnight despite use of Control IQ 3. Goiter: Her thyroid gland is stable today.    PLAN:   1. Diagnostic: A1C and BG as above. Continue home monitoring.  2. Therapeutic:  Discussed changes since last visit. Insulin pump settings:    Time Basal Rate Correction factor Carb ratio Target BG   12a 0.95 -> 0.9 40mg /dL 5 mg/dL  4a 1.3 -> 1.2 40 mg/dL 5 284 mg/dL  6a 132 4.4-> 40 mg/dL 5 0.10 mg/dL  8a 272 1.1 40mg /dL 5-> 4 5.36_> mg/dL  1p 40 mg/dl 5 644 mg/dL  6p 0.34 -> 1.1 40mg /dL 5 742 mg/dL   Total Basal  5.95 -> 26.5    Duration of insulin   3 hours         3. .Patient education:  Reviewed 638.  Pump changes as above. . Discussed use of temp basal and the difference between when she boluses for high sugars and when she allows the Control IQ to bolus for her. Discussed use of extended bolus. Discussed transition of care to adult endo. Will discuss further at next visit. Ameiya to look into endocrine care at [facility].  4. Follow-up: Return in about 3 months (around 03/12/2020).  Will transition care to Dr. Human resources officer for adult endocrine management.    >40 minutes spent today reviewing the medical chart, counseling the patient/family, and documenting today's encounter.  When a patient is on insulin, intensive monitoring of blood glucose levels is necessary to avoid hyperglycemia and hypoglycemia. Severe hyperglycemia/hypoglycemia can lead to hospital admissions and be life threatening.    [facility], MD

## 2019-12-14 ENCOUNTER — Ambulatory Visit (INDEPENDENT_AMBULATORY_CARE_PROVIDER_SITE_OTHER): Payer: BC Managed Care – PPO | Admitting: Pediatric Endocrinology

## 2020-01-03 NOTE — Progress Notes (Deleted)
 This is a Pediatric Specialist E-Visit (My Chart Video Visit) follow up consult provided via WebEx *** and *** consented to an E-Visit consult today.  Location of patient: *** is at home  Location of provider: Everli Rother, PharmD, CPP, CDCES is at office. .   S:     No chief complaint on file.   Endocrinology provider: Dr. Badik  Patient referred to me by Dr. Badik for DM management. PMH significant for T1DM. Alisha Church was admitted to [facility]'s pediatric ward on 07/19/2010 for new-onset type 1 diabetes mellitus, dehydration, and ketonuria. She had problems with polyuria, polydipsia, and thirst for several weeks prior to admission.Her blood glucose was 450. Her venous pH was 7.39. Her serum bicarbonate was 21. Her urine glucose was greater than 1000. Urine ketones were greater than 40. Hemoglobin A1c was 13.2%. Her insulin C-peptide was 0.52 (normal 0.80 to 3.9). She was started on Lantus insulin as a basal insulin. She was also started on Novolog aspart insulin as her bolus insulin at mealtimes, at bedtime and at 2 AM as needed. She was transitioned to a Medtronic 530G insulin pump 07/25/2012. She later transitioned to Tandem T-Slim/Dexcom with Control IQ.  Patient currently wears Tandem insulin pump and Dexcom G6 CGM with Control IQ.  I connected with *** on *** at *** by video and verified that I am speaking with the correct person using two identifiers.   I discussed the limitations, risks, security and privacy concerns of performing an evaluation and management service by video and the availability of in person appointments. I also discussed with the patient that there may be a patient responsible charge related to this service. The patient expressed understanding and agreed to proceed.  School: *** -Grade level:  Diabetes Diagnosis: 2012  Family History: ***  Patient-Reported BG Readings: *** -Patient {Actions; denies-reports:120008} hypoglycemic events. --Treats  hypoglycemic episode with *** --Hypoglycemic symptoms: ***  Insurance Coverage: BCBS ([facility] plan)  [id number] [place], [place] - [address]  [address], [place] [address]  Phone:  [phone] Fax:  [phone]  DEA #:  [id number]  Medication Adherence -Patient {Actions; denies-reports:120008} adherence with medications.  -Current diabetes medications include: Novolog vial (per pump settings) -Prior diabetes medications include: Lantus/Novolog pens (transitioned to pump)   Pump Settings Time Basal Rate Correction factor Carb ratio Target BG   12a 0.9 40mg/dL 5 150 mg/dL  4a 1.2 40 mg/dL 5 150 mg/dL  6a  1.35 40 mg/dL 5 120 mg/dL  8a 1.1 40mg/dL  4 120 mg/dL  1p 1.15 40 mg/dl 5 120 mg/dL  6p  1.1 40mg/dL 5 120 mg/dL   Total Basal = 26.55          Infusion set sites -Patient-reports infusion set sites are *** --Patient {Actions; denies-reports:120008} independently changing infusion set sites --Patient {Actions; denies-reports:120008} rotating infusion set sites  Diet: Patient reported dietary habits:  Eats *** meals/day and *** snacks/day; Boluses with *** meals/day and *** snacks/day Breakfast:*** Lunch:*** Dinner:*** Snacks:*** Drinks:***  Exercise: Patient-reported exercise habits: ***   Monitoring: Patient {Actions; denies-reports:120008} nocturia (nighttime urination).  Patient {Actions; denies-reports:120008} neuropathy (nerve pain). Patient {Actions; denies-reports:120008} visual changes. (***followed by ophthalmology) Patient {Actions; denies-reports:120008} self foot exams.  -Patient *** wearing socks/slippers in the house and shoes outside.  -Patient *** not currently monitoring for open wounds/cuts on her feet.   O:   Labs:   *** CGM Report (*** - ***)   Time in range (70-180 mg/dL): ***% Low (<62 mg/dL): ***% Very low (<13 mg/dL): ***%   *** Pump Settings  (30 day avg) Avg BG: *** mg/dL Carbs: *** g Bolus: *** units (***%) Basal: *** units (***%) TDD: *** units  Overrides: ***  There were no vitals filed for this visit.  Lab Results  Component Value Date   HGBA1C 8.9 (A) 12/11/2019   HGBA1C 8.3 (A) 09/13/2019   HGBA1C 8.5 (A) 06/01/2019    Lab Results  Component Value Date   CPEPTIDE 0.52 (L) 07/19/2010       Component Value Date/Time   CHOL 146 09/13/2019 1534   TRIG 35 09/13/2019 1534   HDL 78 09/13/2019 1534   CHOLHDL 1.9 09/13/2019 1534   VLDL 12 04/04/2015 1433   LDLCALC 58 09/13/2019 1534    Lab Results  Component Value Date   MICRALBCREAT 3 09/13/2019    Assessment: DM {ACTION; IS/IS NOT:21021397} controlled likely due to ***.   Plan: 1. Insulin pump settings: Time Basal Rate Correction factor Carb ratio Target BG   12a 0.9 40mg /dL 5 mg/dL  4a 1.2 40 mg/dL 5 086 mg/dL  6a  578 40 mg/dL 5 4.69 mg/dL  8a 1.1 40mg /dL  4 629 mg/dL  1p 40 mg/dl 5 528 mg/dL  6p  1.1 40mg /dL 5 4.13 mg/dL   Total Basal = 244        2. Diet: 3. Exercise: 4. Monitoring: a. Continue wearing Dexcom G6 CGM  b. has a diagnosis of diabetes, checks blood glucose readings > 4x per day, uses a subcutaneous insulin pump, and requires frequent adjustments to insulin regimen. This patient will be seen every six months, minimally, to assess adherence to their CGM regimen and diabetes treatment plan. 5. Follow Up:   Written patient instructions provided.    This appointment required *** minutes of patient care (this includes precharting, chart review, review of results, face-to-face care, etc.).  Thank you for involving clinical pharmacist/diabetes educator to assist in providing this patient's care.  010, PharmD, CPP, CDCES

## 2020-01-08 ENCOUNTER — Telehealth (INDEPENDENT_AMBULATORY_CARE_PROVIDER_SITE_OTHER): Payer: BC Managed Care – PPO | Admitting: Pharmacist

## 2020-01-08 NOTE — Progress Notes (Deleted)
This is a Pediatric Specialist E-Visit (My Chart Video Visit) follow up consult provided via WebEx *** and *** consented to an E-Visit consult today.  Location of patient: *** is at home  Location of provider: Zachery Conch, PharmD, CPP, CDCES is at office. .   S:     No chief complaint on file.   Endocrinology provider: Dr. Vanessa Boardman  Patient referred to me by Dr. Vanessa Henryetta for DM management. PMH significant for T1DM. Alisha Church was admitted to [facility]'s pediatric ward on 07/19/2010 for new-onset type 1 diabetes mellitus, dehydration, and ketonuria. She had problems with polyuria, polydipsia, and thirst for several weeks prior to admission.Her blood glucose was 450. Her venous pH was 7.39. Her serum bicarbonate was 21. Her urine glucose was greater than 1000. Urine ketones were greater than 40. Hemoglobin A1c was 13.2%. Her insulin C-peptide was 0.52 (normal 0.80 to 3.9). She was started on Lantus insulin as a basal insulin. She was also started on Novolog aspart insulin as her bolus insulin at mealtimes, at bedtime and at 2 AM as needed. She was transitioned to a Medtronic 530G insulin pump 07/25/2012. She later transitioned to Tandem T-Slim/Dexcom with Control IQ.  Patient currently wears Tandem insulin pump and Dexcom G6 CGM with Control IQ.  I connected with *** on *** at *** by video and verified that I am speaking with the correct person using two identifiers.   I discussed the limitations, risks, security and privacy concerns of performing an evaluation and management service by video and the availability of in person appointments. I also discussed with the patient that there may be a patient responsible charge related to this service. The patient expressed understanding and agreed to proceed.  School: *** -Grade level:  Diabetes Diagnosis: 2012  Family History: ***  Patient-Reported BG Readings: *** -Patient {Actions; denies-reports:120008} hypoglycemic events. --Treats  hypoglycemic episode with *** --Hypoglycemic symptoms: ***  Insurance Coverage: BCBS [facility] plan)  [phone] [place], [place] - [address] [facility] [address]  [address] [place], [place] [address]  Phone:  [phone] Fax:  [phone]  DEA #:  [id number]  Medication Adherence -Patient {Actions; denies-reports:120008} adherence with medications.  -Current diabetes medications include: Novolog vial (per pump settings) -Prior diabetes medications include: Lantus/Novolog pens (transitioned to pump)   Pump Settings Time Basal Rate Correction factor Carb ratio Target BG   12a 0.9 40mg /dL 5 mg/dL  4a 1.2 40 mg/dL 5 412 mg/dL  6a  878 40 mg/dL 5 6.76 mg/dL  8a 1.1 40mg /dL  4 720 mg/dL  1p 40 mg/dl 5 947 mg/dL  6p  1.1 40mg /dL 5 0.96 mg/dL   Total Basal = 283          Infusion set sites -Patient-reports infusion set sites are *** --Patient {Actions; denies-reports:120008} independently changing infusion set sites --Patient {Actions; denies-reports:120008} rotating infusion set sites  Diet: Patient reported dietary habits:  Eats *** meals/day and *** snacks/day; Boluses with *** meals/day and *** snacks/day Breakfast:*** Lunch:*** Dinner:*** Snacks:*** Drinks:***  Exercise: Patient-reported exercise habits: ***   Monitoring: Patient {Actions; denies-reports:120008} nocturia (nighttime urination).  Patient {Actions; denies-reports:120008} neuropathy (nerve pain). Patient {Actions; denies-reports:120008} visual changes. (***followed by ophthalmology) Patient {Actions; denies-reports:120008} self foot exams.  -Patient *** wearing socks/slippers in the house and shoes outside.  -Patient *** not currently monitoring for open wounds/cuts on her feet.   O:   Labs:   *** CGM Report (*** - ***)  Time in range (70-180 mg/dL): ***% Low (<62 mg/dL): ***% Very low (<13 mg/dL): ***%   *** Pump Settings  (30 day avg) Avg BG: *** mg/dL Carbs: *** g Bolus: *** units (***%) Basal: *** units (***%) TDD: *** units  Overrides: ***  There were no vitals filed for this visit.  Lab Results  Component Value Date   HGBA1C 8.9 (A) 12/11/2019   HGBA1C 8.3 (A) 09/13/2019   HGBA1C 8.5 (A) 06/01/2019    Lab Results  Component Value Date   CPEPTIDE 0.52 (L) 07/19/2010       Component Value Date/Time   CHOL 146 09/13/2019 1534   TRIG 35 09/13/2019 1534   HDL 78 09/13/2019 1534   CHOLHDL 1.9 09/13/2019 1534   VLDL 12 04/04/2015 1433   LDLCALC 58 09/13/2019 1534    Lab Results  Component Value Date   MICRALBCREAT 3 09/13/2019    Assessment: DM {ACTION; IS/IS NOT:21021397} controlled likely due to ***.   Plan: 1. Insulin pump settings: Time Basal Rate Correction factor Carb ratio Target BG   12a 0.9 40mg /dL 5 mg/dL  4a 1.2 40 mg/dL 5 086 mg/dL  6a  578 40 mg/dL 5 4.69 mg/dL  8a 1.1 40mg /dL  4 629 mg/dL  1p 40 mg/dl 5 528 mg/dL  6p  1.1 40mg /dL 5 4.13 mg/dL   Total Basal = 244        2. Diet: 3. Exercise: 4. Monitoring: a. Continue wearing Dexcom G6 CGM  b. has a diagnosis of diabetes, checks blood glucose readings > 4x per day, uses a subcutaneous insulin pump, and requires frequent adjustments to insulin regimen. This patient will be seen every six months, minimally, to assess adherence to their CGM regimen and diabetes treatment plan. 5. Follow Up:   Written patient instructions provided.    This appointment required *** minutes of patient care (this includes precharting, chart review, review of results, face-to-face care, etc.).  Thank you for involving clinical pharmacist/diabetes educator to assist in providing this patient's care.  010, PharmD, CPP, CDCES

## 2020-01-10 ENCOUNTER — Telehealth (INDEPENDENT_AMBULATORY_CARE_PROVIDER_SITE_OTHER): Payer: BC Managed Care – PPO | Admitting: Pharmacist

## 2020-01-10 ENCOUNTER — Encounter (INDEPENDENT_AMBULATORY_CARE_PROVIDER_SITE_OTHER): Payer: Self-pay

## 2020-03-12 ENCOUNTER — Ambulatory Visit (INDEPENDENT_AMBULATORY_CARE_PROVIDER_SITE_OTHER): Payer: BC Managed Care – PPO | Admitting: "Endocrinology

## 2020-03-12 NOTE — Progress Notes (Deleted)
Subjective:  Patient Name: Alisha Church Date of Birth: 13-Apr-2000  MRN: [id number]  Alisha Church  presents to the office today for follow-up evaluation and management of her type 1 diabetes, growth delay, weight loss, and goiter  HISTORY OF PRESENT ILLNESS:   Alisha Church is a 20 y.o. African-American young lady.   Alisha Church was seen by herself  1. Alisha Church was admitted to [facility]'s pediatric ward on 07/19/2010 for new-onset type 1 diabetes mellitus, dehydration, and ketonuria. She had problems with polyuria, polydipsia, and thirst for several weeks prior to admission. She was initially quite dehydrated. Her blood glucose was 450. Her venous pH was 7.39. Her serum bicarbonate was 21. Her urine glucose was greater than 1000. Urine ketones were greater than 40. Hemoglobin A1c was 13.2%. Her insulin C-peptide was 0.52 (normal 0.80 to 3.9). She was started on Lantus insulin as a basal insulin. She was also started on Novolog aspart insulin as her bolus insulin at mealtimes, at bedtime and at 2 AM as needed. She was transitioned to a Medtronic 530G insulin pump 07/25/2012. She later transitioned to Tandem T-Slim/Dexcom with Control IQ.   2. The patient's last PSSG visit was on 12/11/19. I changed several basal rate settings.   A. In the interim, she has been generally healthy.   She has continued working at [employer]. She says that it is going well there.   She is feeling sad about switching to adult care.   She says that her diabetes management had been good. She says that it has been running high in the past month due to increased stress and anxiety. She just bought a BMW for herself (used).   No issues with getting her supplies. She has been wearing her Dexcom. She has been trying new sites for her Dexcom- but has not found them to be very effective - and she says that her Dexcom falls off more easily when she puts it in novel spots.   Some people in her FB diabetes group were putting  their CGM on their calves- she liked it there but it only lasted a few days.   She is still skating on Sundays. She is using a 50% temp basal.  She has not been getting hypoglycemia at work recently- but she has started to have some at night (60s).  She says that this is pump site dependant- some sites absorb better than others.    3. Pertinent Review of Systems:  Constitutional: The patient feels "good". The patient seems healthy and active. Eyes: Vision seems to be good with her glasses/contacts. There are no recognized eye problems. Her last eye exam was December 2020- got a change in her rx.  Neck: The patient has no complaints of anterior neck swelling, soreness, tenderness, pressure, discomfort, or difficulty swallowing.   Heart: Heart rate increases with exercise or other physical activity. The patient has no complaints of palpitations, irregular heart beats, chest pain, or chest pressure.   Lungs: No asthma or wheezing.,  Gastrointestinal: Bowel movents seem normal. The patient has no complaints of excessive hunger, acid reflux, upset stomach, stomach aches or pains, diarrhea, or constipation.  Legs: Muscle mass and strength seem normal. There are no complaints of numbness, tingling, burning, or pain. No edema is noted.  Feet: There are no obvious foot problems. There are no complaints of numbness, tingling, burning, or pain. No edema is noted. Neurologic: There are no recognized problems with muscle movement and strength, sensation, or coordination. GYN: LMP was  10/18 Covid: not vaccinated   Diabetes ID: charm bracelet -  need a new one    Annual Labs:  Done August 2021- mild anemia- now taking iron. No other issues. Due August 2022   4. CGM and pump download combination:        Last visit: avg blood glucose of 208. Range 93-318.65% above target.  avg sensor glucose of 177 Range 41-400. 41% >180, 55% 80-180. 4% <80  49% basal 28.5 u/day 27% food bolus 6% correction  insulin  avg total daily insulin of 57.71 units/day                 PAST MEDICAL, FAMILY, AND SOCIAL HISTORY  Past Medical History:  Diagnosis Date  . Dehydration   . Diabetes mellitus   . Diabetes mellitus without complication (HCC)    Phreesia 01/10/2020  . Ketonuria   . Type 1 diabetes mellitus not at goal [facility])     Family History  Problem Relation Age of Onset  . Diabetes Maternal Aunt   . Heart disease Maternal Aunt   . Hypothyroidism Maternal Grandmother   . Thyroid disease Maternal Grandmother   . Heart disease Maternal Grandmother   . Diabetes Cousin   . Thyroid disease Mother      Current Outpatient Medications:  .  clindamycin (CLEOCIN T) 1 % external solution, APPLY TO ACNE EVERY DAY, Disp: , Rfl:  .  Glucagon (BAQSIMI TWO PACK) 3 MG/DOSE POWD, Place 1 each into the nose as needed (severe hypoglycmia with unresponsiveness). (Patient not taking: Reported on 05/10/2018), Disp: 1 each, Rfl: 3 .  glucagon 1 MG injection, Inject 1.0 mg into anterior thigh one time if unconscious, unresponsive, unable to swallow and or has seizure., Disp: 2 each, Rfl: 4 .  glucose blood (ONETOUCH VERIO) test strip, CHECK GLUCOSE 6 TIMES DAILY, Disp: , Rfl:  .  insulin aspart (NOVOLOG) 100 UNIT/ML injection, INJECT 300 UNITS INTO PUMP EVERY 48 HOURS AS DIRECTED, Disp: 120 mL, Rfl: 1 .  Multiple Vitamins-Minerals (WOMENS MULTIVITAMIN PO), Take by mouth., Disp: , Rfl:  .  OneTouch Delica Lancets 33G MISC, Use to check BG 10x day, Disp: , Rfl:  .  tretinoin (RETIN-A) 0.025 % cream, APPLY TO FACE ONCE A DAY IN THE EVENING, Disp: , Rfl:   Allergies as of 03/12/2020  . (No Known Allergies)     reports that she has never smoked. She has never used smokeless tobacco. She reports that she does not drink alcohol and does not use drugs. Pediatric History  Patient Parents  . Heckstall,Danita (Mother)   Other Topics Concern  . 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    Working at [employer]: Iona Hansen, NP  ROS: There are no other significant problems involving Ival's other body systems.   Objective:  Vital Signs:    There were no vitals taken for this visit. Growth percentile SmartLinks can only be used for patients less than 48 years old.   Ht Readings from Last 3 Encounters:  09/13/19 5' 0.67" (1.541 m)  01/30/19 5' 0.63" (1.54 m)  09/07/18 5\' 1"  (1.549 m) (10 %, Z= -1.29)*   * Growth percentiles are based on CDC (Girls, 2-20 Years) data.   Wt Readings from Last 3 Encounters:  12/11/19 128 lb (58.1 kg)  09/13/19  123 lb 9.6 oz (56.1 kg)  06/01/19 120 lb (54.4 kg)   HC Readings from Last 3 Encounters:  No data found for HC   There is no height or weight on file to calculate BSA. Facility age limit for growth percentiles is 20 years. Facility age limit for growth percentiles is 20 years.   PHYSICAL EXAM:   Constitutional: The patient appears healthy and well nourished. The patient's height and weight are normal for age. Weight has increased some.  Head: The head is normocephalic. Face: The face appears normal. There are no obvious dysmorphic features. Eyes: The eyes appear to be normally formed and spaced. Gaze is conjugate. There is no obvious arcus or proptosis. Moisture appears normal. Ears: The ears are normally placed and appear externally normal. Mouth: The oropharynx and tongue appear normal. Dentition appears to be normal for age. Oral moisture is normal. Neck: The neck appears to be visibly normal. The thyroid gland is normal for age The consistency of the thyroid gland is firm.. The thyroid gland is not tender to palpation. Lungs: No increased work of breathing Heart: normal pulses and peripheral perfusion  Abdomen: The abdomen appears to be normal in size for the patient's age. There  is no obvious hepatomegaly, splenomegaly, or other mass effect.  Arms: Muscle size and bulk are normal for age. Hands: There is no obvious tremor. Phalangeal and metacarpophalangeal joints are normal. Palmar muscles are normal for age. Palmar skin is normal. Palmar moisture is also normal. Legs: Muscles appear normal for age. No edema is present. Feet: Feet are normally formed. Neurologic: Strength is normal for age in both the upper and lower extremities. Muscle tone is normal. Sensation to touch is normal in both the legs and feet.    LAB DATA:   Lab Results  Component Value Date   HGBA1C 8.9 (A) 12/11/2019   HGBA1C 8.3 (A) 09/13/2019   HGBA1C 8.5 (A) 06/01/2019     No results found for this or any previous visit (from the past 504 hour(s)).    Assessment and Plan:   ASSESSMENT: Judieth is an 20 y.o. AA female with type 1 diabetes managed on insulin pump   1. Type 1 diabetes on insulin pump:  - POC A1C as above - POC BG as above - Glucose control is stable - A1C is still higher thanADA goal of 7% - Glycemic control tends to increase after breakfast and lunch - she is dropping low overnight - Using Control IQ which is bolusing her between meals. Reviewed being careful about carb counting and increased carb coverage.   2. Hypoglycemia: mostly overnight despite use of Control IQ 3. Goiter: Her thyroid gland is stable today.    PLAN:   1. Diagnostic: A1C and BG as above. Continue home monitoring.  2. Therapeutic:  Discussed changes since last visit. Insulin pump settings:    Time Basal Rate Correction factor Carb ratio Target BG   12a 0.95 -> 0.9 40mg /dL 5 mg/dL  4a 1.3 -> 1.2 40 mg/dL 5 182 mg/dL  6a 993 7.1-> 40 mg/dL 5 6.96 mg/dL  8a 789 1.1 40mg /dL 5-> 4 3.81_> mg/dL  1p 40 mg/dl 5 017 mg/dL  6p 5.10 -> 1.1 40mg /dL 5 258 mg/dL   Total Basal  5.27 -> 26.5    Duration of insulin   3 hours         3. .Patient education:  Reviewed 782.  Pump changes as above. . Discussed use of  temp basal and the difference between when she boluses for high sugars and when she allows the Control IQ to bolus for her. Discussed use of extended bolus. Discussed transition of care to adult endo. Will discuss further at next visit. Ameiya [facility].  4. Follow-up: No follow-ups on file.  Will transition care to Dr. Fransico Coner Gibbard for adult endocrine management.    >40 minutes spent today reviewing the medical chart, counseling the patient/family, and documenting today's encounter.  When a patient is on insulin, intensive monitoring of blood glucose levels is necessary to avoid hyperglycemia and hypoglycemia. Severe hyperglycemia/hypoglycemia can lead to hospital admissions and be life threatening.    Molli KnockMichael Shakti Fleer, MD

## 2020-04-18 ENCOUNTER — Ambulatory Visit (INDEPENDENT_AMBULATORY_CARE_PROVIDER_SITE_OTHER): Payer: BC Managed Care – PPO | Admitting: "Endocrinology

## 2020-04-18 ENCOUNTER — Other Ambulatory Visit: Payer: Self-pay

## 2020-04-18 ENCOUNTER — Encounter (INDEPENDENT_AMBULATORY_CARE_PROVIDER_SITE_OTHER): Payer: Self-pay | Admitting: "Endocrinology

## 2020-04-18 VITALS — BP 122/60 | HR 86 | Wt 128.0 lb

## 2020-04-18 DIAGNOSIS — E11649 Type 2 diabetes mellitus with hypoglycemia without coma: Secondary | ICD-10-CM

## 2020-04-18 DIAGNOSIS — E049 Nontoxic goiter, unspecified: Secondary | ICD-10-CM

## 2020-04-18 DIAGNOSIS — D509 Iron deficiency anemia, unspecified: Secondary | ICD-10-CM | POA: Diagnosis not present

## 2020-04-18 DIAGNOSIS — E109 Type 1 diabetes mellitus without complications: Secondary | ICD-10-CM | POA: Diagnosis not present

## 2020-04-18 LAB — POCT GLYCOSYLATED HEMOGLOBIN (HGB A1C): Hemoglobin A1C: 8.6 % — AB (ref 4.0–5.6)

## 2020-04-18 LAB — POCT GLUCOSE (DEVICE FOR HOME USE): POC Glucose: 236 mg/dl — AB (ref 70–99)

## 2020-04-18 NOTE — Patient Instructions (Signed)
Follow up visit in 3 months. 

## 2020-04-18 NOTE — Progress Notes (Addendum)
Subjective:  Patient Name: Alisha Church Date of Birth: 08-05-2000  MRN: [id number]  Alisha Church  presents to the office today for follow-up evaluation and management of her type 1 diabetes, growth delay, weight loss, and goiter  HISTORY OF PRESENT ILLNESS:   Alisha Church is a 20 y.o. African-American young lady.   Alisha Church was unaccompanied.   1. Alisha Church's initial pediatric endocrine consultation occurred on 07/24/2010 on an inpatient basis.    Alisha Church was admitted to [facility]'s pediatric ward on 07/19/2010 for new-onset type 1 diabetes mellitus, dehydration, and ketonuria. Dr. Fransico Kam Rahimi consulted on her at that time. She had had problems with polyuria, polydipsia, and thirst for several weeks prior to admission. She was initially quite dehydrated. Her blood glucose was 450. Her venous pH was 7.39. Her serum bicarbonate was 21. Her urine glucose was greater than 1000. Urine ketones were greater than 40. Hemoglobin A1c was 13.2%. Her insulin C-peptide was 0.52 (reference 0.80 to 3.9). Her GAD antibody was markedly elevated at >30. Her islet cell antibody was markedly elevated an >80. Her insulin autoantibodies were <0.1.   B. She was started on Lantus insulin as a basal insulin. She was also started on Novolog aspart insulin as her bolus insulin at mealtimes, at bedtime and at 2 AM as needed.   2. Clinical course;  A. She was transitioned to a Medtronic 530G insulin pump 07/25/2012.   B. She  transitioned to Tandem T-Slim/Dexcom with Control IQ on 08/12/2017.    3. The patient's last PSSG visit was on 12/11/19 with [name]. [name] changed most of her basal rates and one carb ratio. Those changes helped to reduce the number of low BGs during the night, but she is not having more high BGs after lunch.  A. In the interim, she has been generally healthy.   B. She is on a Tandem T-Slim pump and Dexcom G6.   C. She had some problems with her insurance, so has had some delays in  supplies.  D. She is taking 1000 IU of vitamin D per day. Despite the fact that she knows that she is anemic, she is not routinely taking her iron supplement.   E. This is Alisha Church's first visit with me in many years. She has now "aged out" of Dr. Elberta Leatherwood.   3. Pertinent Review of Systems:  Constitutional: Alisha Church feels "pretty good". She seems healthy and active. Eyes: Vision seems to be good with her glasses/contacts. There are no recognized eye problems. Her last eye exam was in June 2021.   Neck: She has no complaints of anterior neck swelling, soreness, tenderness, pressure, discomfort, or difficulty swallowing.   Heart: Heart rate increases with exercise or other physical activity. She has no complaints of palpitations, irregular heart beats, chest pain, or chest pressure.   Lungs: No asthma or wheezing. She sometimes feels SOB when she is rushing around.  Gastrointestinal: Bowel movents seem normal. The patient has no complaints of excessive hunger, acid reflux, upset stomach, stomach aches or pains, bloating after meals, diarrhea, or constipation.  Hands: No problems Legs: Muscle mass and strength seem normal. There are no complaints of numbness, tingling, burning, or pain. No edema is noted.  Feet: There are no obvious foot problems. There are no complaints of numbness, tingling, burning, or pain. No edema is noted. Neurologic: There are no recognized problems with muscle movement and strength, sensation, or coordination. GYN: LMP was 03/22/20. Menses occur regularly. She is not on birth  control.  Covid: not vaccinated   Diabetes ID: She has a tattoo on her inner right arm above the wrist. Annual Labs:  Done August 2021- mild anemia- now taking iron. No other issues. Due August 2022  4. CGM and pump download combination:    We have data for the past 4 weeks.  Pump download: Average CBG 240, range 111->400.  CGM download: Average SG 204, range 41->400 In general, SGS from  midnight to 10 AM tend to be fairly good. SGs after lunch and dinner are often very high, especially when she eats high glycemic index foods. She frequently gives boluses that are too low for her carb intake. When she eats a large amount of carbs she often intentionally boluses less that she thinks she should take in order to prevent later hypoglycemia.       PAST MEDICAL, FAMILY, AND SOCIAL HISTORY  Past Medical History:  Diagnosis Date  . Dehydration   . Diabetes mellitus   . Diabetes mellitus without complication (HCC)    Phreesia 01/10/2020  . Ketonuria   . Type 1 diabetes mellitus not at goal [facility])     Family History  Problem Relation Age of Onset  . Diabetes Maternal Aunt   . Heart disease Maternal Aunt   . Hypothyroidism Maternal Grandmother   . Thyroid disease Maternal Grandmother   . Heart disease Maternal Grandmother   . Diabetes Cousin   . Thyroid disease Mother      Current Outpatient Medications:  .  clindamycin (CLEOCIN T) 1 % external solution, APPLY TO ACNE EVERY DAY, Disp: , Rfl:  .  glucagon 1 MG injection, Inject 1.0 mg into anterior thigh one time if unconscious, unresponsive, unable to swallow and or has seizure., Disp: 2 each, Rfl: 4 .  glucose blood (ONETOUCH VERIO) test strip, CHECK GLUCOSE 6 TIMES DAILY, Disp: , Rfl:  .  insulin aspart (NOVOLOG) 100 UNIT/ML injection, INJECT 300 UNITS INTO PUMP EVERY 48 HOURS AS DIRECTED, Disp: 120 mL, Rfl: 1 .  Multiple Vitamins-Minerals (WOMENS MULTIVITAMIN PO), Take by mouth., Disp: , Rfl:  .  OneTouch Delica Lancets 33G MISC, Use to check BG 10x day, Disp: , Rfl:  .  tretinoin (RETIN-A) 0.025 % cream, APPLY TO FACE ONCE A DAY IN THE EVENING, Disp: , Rfl:  .  Glucagon (BAQSIMI TWO PACK) 3 MG/DOSE POWD, Place 1 each into the nose as needed (severe hypoglycmia with unresponsiveness). (Patient not taking: No sig reported), Disp: 1 each, Rfl: 3  Allergies as of 04/18/2020  . (No Known Allergies)     reports that she has  never smoked. She has never used smokeless tobacco. She reports that she does not drink alcohol and does not use drugs. Pediatric History  Patient Parents  . Heckstall,Danita (Mother)   Other Topics Concern  . Not on file  Social History Narrative   Graduated from [employer] in May for Engineer, site.    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    Home and family: She lives at home her parents.  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  Primary Care Provider: Iona Hansen, NP [facility] [place] [facility]  ROS: There are no other significant problems involving Kamesha's other body systems.   Objective:  Vital Signs:    BP 122/60 (BP Location: Left Arm, Patient Position: Sitting,  Cuff Size: Normal)   Pulse 86   Wt 128 lb (58.1 kg)   BMI 24.45 kg/m  Growth percentile SmartLinks can only be used for patients less than 65 years old.   Ht Readings from Last 3 Encounters:  09/13/19 5' 0.67" (1.541 m)  01/30/19 5' 0.63" (1.54 m)  09/07/18 5\' 1"  (1.549 m) (10 %, Z= -1.29)*   * Growth percentiles are based on CDC (Girls, 2-20 Years) data.   Wt Readings from Last 3 Encounters:  04/18/20 128 lb (58.1 kg)  12/11/19 128 lb (58.1 kg)  09/13/19 123 lb 9.6 oz (56.1 kg)   HC Readings from Last 3 Encounters:  No data found for [facility]   Body surface area is 1.58 meters squared. Facility age limit for growth percentiles is 20 years. Facility age limit for growth percentiles is 20 years.   PHYSICAL EXAM:   Constitutional: The patient appears healthy and well nourished. The patient's height and weight are normal for age. Her weight has remained stable since her last visit. Head: The head is normocephalic. Face: The face appears normal. There are no obvious dysmorphic features. Eyes: The eyes appear to be normally formed and spaced. Gaze is conjugate. There  is no obvious arcus or proptosis. Moisture appears normal. Ears: The ears are normally placed and appear externally normal. Mouth: The oropharynx and tongue appear normal. Dentition appears to be normal for age. Oral moisture is normal. Neck: The neck appears to be visibly normal. The thyroid gland is enlarged at about 21+ grams in size. The consistency of the thyroid gland is firm. The thyroid gland is not tender to palpation. Lungs: No increased work of breathing Heart: Normal S1 and S2, no abnormal heart sounds or murmurs Abdomen: The abdomen appears to be normal in size for the patient's age. There is no obvious hepatomegaly, splenomegaly, or other mass effect.  Arms: Muscle size and bulk are normal for age. Hands: There is no obvious tremor. Phalangeal and metacarpophalangeal joints are normal. Palmar muscles are normal for age. Palmar skin is normal. Palmar moisture is also normal. Legs: Muscles appear normal for age. No edema is present. Feet: Feet are normally formed. DP pulses are norma 1+.  Neurologic: Strength is normal for age in both the upper and lower extremities. Muscle tone is normal. Sensation to touch is normal in both the legs and feet.    LAB DATA:   Lab Results  Component Value Date   HGBA1C 8.6 (A) 04/18/2020   HGBA1C 8.9 (A) 12/11/2019   HGBA1C 8.3 (A) 09/13/2019     Results for orders placed or performed in visit on 04/18/20 (from the past 504 hour(s))  POCT Glucose (Device for Home Use)   Collection Time: 04/18/20  2:28 PM  Result Value Ref Range   Glucose Fasting, POC     POC Glucose 236 (A) 70 - 99 mg/dl  POCT glycosylated hemoglobin (Hb A1C)   Collection Time: 04/18/20  2:40 PM  Result Value Ref Range   Hemoglobin A1C 8.6 (A) 4.0 - 5.6 %   HbA1c POC (<> result, manual entry)     HbA1c, POC (prediabetic range)     HbA1c, POC (controlled diabetic range)     Labs 04/18/20: HbA1c 8.6%, CBG 236  Labs 09/13/19: HbA1c 8.3%, CBG 200; TSH 1.39, free T4 0.9;  CMP normal; CBC normal, except Hgb 10.4 (ref 11.7-15.5), Hct 34.1 (ref 35-45), MCV 73.8 (ref 80-100), MCH 22.5 (ref 27-33), MCHC 30.5 (ref 32-36), RDW 15.8 (ref 11-15);  cholesterol 146, triglycerides 35, HDL 78, LDL 58; urinary microalbumin/creatinine ratio 3   Assessment and Plan:   ASSESSMENT: Alisha Camelmieya is an 20 y.o. AA female with type 1 diabetes managed on insulin pump   1. Type 1 diabetes on insulin pump:   A. Her HbA1C is higher that at her last visit and higher than ADA goal of 7%  B. Glycemic control tends to worsen in the afternoons and evenings especially after high glycemic index meals.   C. BGs during the night are much better.   D. Reviewed being careful about carb counting and increased carb coverage. Whenever her BGs are unusually high or low, please take a minute to analyze why.   2. Hypoglycemia: She is having many fewer low BGs since Dr. Vanessa DurhamBadik changed her basal rates at her last visit.   3. Goiter: Her thyroid gland is stable today. She was euthyroid in August 2021. 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  4. Anemia: She needs to take her iron regularly.    PLAN:   1. Diagnostic: A1C and BG today as above. Continue home monitoring.  2. Therapeutic:  Discussed changes since last visit. Continue Insulin pump settings:    Time Basal Rate Correction factor Carb ratio Target BG   12a 0.9 40mg /dL 5 161150 mg/dL  4a 1.2 40 mg/dL 5 096150 mg/dL  6a   0.451.35 40 mg/dL 5 409120 mg/dL  8a 1.1 40mg /dL 4 811120 mg/dL  1p   9.141.15 40 mg/dl 5 782120 mg/dL  6p 1.1 40mg /dL 5 956120 mg/dL   Total Basal  21.326.9 -> 26.5    Duration of insulin   3 hours      3. Patient education:  Reviewed Human resources officerpump/cgm download. Discussed use of accurate carb counts and food boluses when she boluses for high sugars . Discussed allowing the Control IQ to bolus for her.  4. Follow-up: 3 months. Order annual lab tests then.   Level of Service: This visit lasted in excess of 60 minutes today spent reviewing the  medical chart, getting to know her, counseling the patient, and documenting today's encounter.  When a patient is on insulin, intensive monitoring of blood glucose levels is necessary to avoid hyperglycemia and hypoglycemia. Severe hyperglycemia/hypoglycemia can lead to hospital admissions and be life threatening.    Molli KnockMichael Theola Cuellar, MD, CDE Adult and Pediatric Endocrinology

## 2020-05-10 ENCOUNTER — Other Ambulatory Visit (INDEPENDENT_AMBULATORY_CARE_PROVIDER_SITE_OTHER): Payer: Self-pay | Admitting: "Endocrinology

## 2020-08-08 ENCOUNTER — Ambulatory Visit (INDEPENDENT_AMBULATORY_CARE_PROVIDER_SITE_OTHER): Payer: BC Managed Care – PPO | Admitting: "Endocrinology

## 2020-08-28 NOTE — Progress Notes (Signed)
Subjective:  Patient Name: Alisha Church Date of Birth: 2000/03/23  MRN: [id number]  Alisha Church  presents to the office today for follow-up evaluation and management of her type 1 diabetes, growth delay, weight loss, and goiter  HISTORY OF PRESENT ILLNESS:   Alisha Church is a 20 y.o. African-American young lady.   Alisha Church was unaccompanied.   1. Tymesha's initial pediatric endocrine consultation occurred on 07/24/2010 on an inpatient basis.    Alisha Church was admitted to [facility]'s pediatric ward on 07/19/2010 for new-onset type 1 diabetes mellitus, dehydration, and ketonuria. Dr. Fransico Ameilia Rattan consulted on her at that time. She had had problems with polyuria, polydipsia, and thirst for several weeks prior to admission. She was initially quite dehydrated. Her blood glucose was 450. Her venous pH was 7.39. Her serum bicarbonate was 21. Her urine glucose was greater than 1000. Urine ketones were greater than 40. Hemoglobin A1c was 13.2%. Her insulin C-peptide was 0.52 (reference 0.80 to 3.9). Her GAD antibody was markedly elevated at >30. Her islet cell antibody was markedly elevated an >80. Her insulin autoantibodies were <0.1.   B. She was started on Lantus insulin as a basal insulin. She was also started on Novolog aspart insulin as her bolus insulin at mealtimes, at bedtime and at 2 AM as needed.   2. Clinical course;  A. She was transitioned to a Medtronic 530G insulin pump 07/25/2012.   B. She  transitioned to Tandem T-Slim/Dexcom with Control IQ on 08/12/2017.    3. The patient's last PSSG visit was on 04/18/20. I continued her insulin pump settings.    A. In the interim, she has been generally healthy.   B. She is on a Tandem T-Slim pump and Dexcom G6.   C. She had some problems with her insurance, so has had some delays in supplies.  D. She is taking 1000 IU of vitamin D per day. Despite the fact that she knows that she is anemic, she is not routinely taking her iron supplement.    E. This is Alisha Church's first visit with me in many years. She has now "aged out" of Dr. Elberta Leatherwood.   3. Pertinent Review of Systems:  Constitutional: Alisha Church feels "pretty good". She seems healthy and active. Eyes: Vision seems to be good with her glasses/contacts. There are no recognized eye problems. Her last eye exam was in June 2021.   Neck: She has no complaints of anterior neck swelling, soreness, tenderness, pressure, discomfort, or difficulty swallowing.   Heart: Heart rate increases with exercise or other physical activity. She has no complaints of palpitations, irregular heart beats, chest pain, or chest pressure.   Lungs: No asthma or wheezing. She sometimes feels SOB when she is rushing around.  Gastrointestinal: Bowel movents seem normal. The patient has no complaints of excessive hunger, acid reflux, upset stomach, stomach aches or pains, bloating after meals, diarrhea, or constipation.  Hands: No problems Legs: Muscle mass and strength seem normal. There are no complaints of numbness, tingling, burning, or pain. No edema is noted.  Feet: There are no obvious foot problems. There are no complaints of numbness, tingling, burning, or pain. No edema is noted. Neurologic: There are no recognized problems with muscle movement and strength, sensation, or coordination. GYN: LMP was 03/22/20. Menses occur regularly. She is not on birth control.  Covid: not vaccinated   Diabetes ID: She has a tattoo on her inner right arm above the wrist. Annual Labs:  Done August 2021- mild anemia- now  taking iron. No other issues. Due August 2022  4. CGM and pump download combination:    We have data for the past 4 weeks.  Pump download: Average CBG 240, range 111->400.  CGM download: Average SG 204, range 41->400 In general, SGS from midnight to 10 AM tend to be fairly good. SGs after lunch and dinner are often very high, especially when she eats high glycemic index foods. She frequently gives  boluses that are too low for her carb intake. When she eats a large amount of carbs she often intentionally boluses less that she thinks she should take in order to prevent later hypoglycemia.       PAST MEDICAL, FAMILY, AND SOCIAL HISTORY  Past Medical History:  Diagnosis Date   Dehydration    Diabetes mellitus    Diabetes mellitus without complication (HCC)    Phreesia 01/10/2020   Ketonuria    Type 1 diabetes mellitus not at goal [facility])     Family History  Problem Relation Age of Onset   Diabetes Maternal Aunt    Heart disease Maternal Aunt    Hypothyroidism Maternal Grandmother    Thyroid disease Maternal Grandmother    Heart disease Maternal Grandmother    Diabetes Cousin    Thyroid disease Mother      Current Outpatient Medications:    clindamycin (CLEOCIN T) 1 % external solution, APPLY TO ACNE EVERY DAY, Disp: , Rfl:    Glucagon (BAQSIMI TWO PACK) 3 MG/DOSE POWD, Place 1 each into the nose as needed (severe hypoglycmia with unresponsiveness). (Patient not taking: No sig reported), Disp: 1 each, Rfl: 3   glucagon 1 MG injection, Inject 1.0 mg into anterior thigh one time if unconscious, unresponsive, unable to swallow and or has seizure., Disp: 2 each, Rfl: 4   glucose blood (ONETOUCH VERIO) test strip, CHECK GLUCOSE 6 TIMES DAILY, Disp: , Rfl:    insulin aspart (NOVOLOG) 100 UNIT/ML injection, INJECT 300 UNITS INTO PUMP EVERY 48 HOURS AS DIRECTED, Disp: 30 mL, Rfl: 7   Multiple Vitamins-Minerals (WOMENS MULTIVITAMIN PO), Take by mouth., Disp: , Rfl:    OneTouch Delica Lancets 33G MISC, Use to check BG 10x day, Disp: , Rfl:    tretinoin (RETIN-A) 0.025 % cream, APPLY TO FACE ONCE A DAY IN THE EVENING, Disp: , Rfl:   Allergies as of 08/29/2020   (No Known Allergies)     reports that she has never smoked. She has never used smokeless tobacco. She reports that she does not drink alcohol and does not use drugs. Pediatric History  Patient Parents   Heckstall,Danita  (Mother)   Other Topics Concern   Not on file  Social History Narrative   Graduated from [employer] in May for Engineer, site.    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    Home and family: She lives at home her parents.  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  Primary Care Provider: Iona Hansen, NP [facility]  ROS: There are no other significant problems involving Alisha Church's other body systems.   Objective:  Vital Signs:    There were no vitals taken for this visit. Growth percentile SmartLinks can only be used for patients less than 43 years old.   Ht Readings from Last 3 Encounters:  09/13/19 5' 0.67" (1.541 m)  01/30/19 5'  0.63" (1.54 m)  09/07/18 5\' 1"  (1.549 m) (10 %, Z= -1.29)*   * Growth percentiles are based on CDC (Girls, 2-20 Years) data.   Wt Readings from Last 3 Encounters:  04/18/20 128 lb (58.1 kg)  12/11/19 128 lb (58.1 kg)  09/13/19 123 lb 9.6 oz (56.1 kg)   HC Readings from Last 3 Encounters:  No data found for HC   There is no height or weight on file to calculate BSA. Facility age limit for growth percentiles is 20 years. Facility age limit for growth percentiles is 20 years.   PHYSICAL EXAM:   Constitutional: The patient appears healthy and well nourished. The patient's height and weight are normal for age. Her weight has remained stable since her last visit. Head: The head is normocephalic. Face: The face appears normal. There are no obvious dysmorphic features. Eyes: The eyes appear to be normally formed and spaced. Gaze is conjugate. There is no obvious arcus or proptosis. Moisture appears normal. Ears: The ears are normally placed and appear externally normal. Mouth: The oropharynx and tongue appear normal. Dentition appears to be normal for age. Oral moisture is normal. Neck: The neck  appears to be visibly normal. The thyroid gland is enlarged at about 21+ grams in size. The consistency of the thyroid gland is firm. The thyroid gland is not tender to palpation. Lungs: No increased work of breathing Heart: Normal S1 and S2, no abnormal heart sounds or murmurs Abdomen: The abdomen appears to be normal in size for the patient's age. There is no obvious hepatomegaly, splenomegaly, or other mass effect.  Arms: Muscle size and bulk are normal for age. Hands: There is no obvious tremor. Phalangeal and metacarpophalangeal joints are normal. Palmar muscles are normal for age. Palmar skin is normal. Palmar moisture is also normal. Legs: Muscles appear normal for age. No edema is present. Feet: Feet are normally formed. DP pulses are norma 1+.  Neurologic: Strength is normal for age in both the upper and lower extremities. Muscle tone is normal. Sensation to touch is normal in both the legs and feet.    LAB DATA:   Lab Results  Component Value Date   HGBA1C 8.6 (A) 04/18/2020   HGBA1C 8.9 (A) 12/11/2019   HGBA1C 8.3 (A) 09/13/2019     No results found for this or any previous visit (from the past 504 hour(s)).  Labs 04/18/20: HbA1c 8.6%, CBG 236  Labs 09/13/19: HbA1c 8.3%, CBG 200; TSH 1.39, free T4 0.9; CMP normal; CBC normal, except Hgb 10.4 (ref 11.7-15.5), Hct 34.1 (ref 35-45), MCV 73.8 (ref 80-100), MCH 22.5 (ref 27-33), MCHC 30.5 (ref 32-36), RDW 15.8 (ref 11-15); cholesterol 146, triglycerides 35, HDL 78, LDL 58; urinary microalbumin/creatinine ratio 3   Assessment and Plan:   ASSESSMENT: Alisha Church is an 20 y.o. AA female with type 1 diabetes managed on insulin pump   1. Type 1 diabetes on insulin pump:   A. Her HbA1C is higher that at her last visit and higher than ADA goal of 7%  B. Glycemic control tends to worsen in the afternoons and evenings especially after high glycemic index meals.   C. BGs during the night are much better.   D. Reviewed being careful about carb  counting and increased carb coverage. Whenever her BGs are unusually high or low, please take a minute to analyze why.   2. Hypoglycemia: She is having many fewer low BGs since Dr. 36 changed her basal rates at her last visit.  3. Goiter: Her thyroid gland is stable today. She was euthyroid in August 2021. 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  4. Anemia: She needs to take her iron regularly.    PLAN:   1. Diagnostic: A1C and BG today as above. Continue home monitoring.  2. Therapeutic:  Discussed changes since last visit. Continue Insulin pump settings:    Time Basal Rate Correction factor Carb ratio Target BG   12a 0.9 40mg /dL 5 161150 mg/dL  4a 1.2 40 mg/dL 5 096150 mg/dL  6a   0.451.35 40 mg/dL 5 409120 mg/dL  8a 1.1 40mg /dL 4 811120 mg/dL  1p   9.141.15 40 mg/dl 5 782120 mg/dL  6p 1.1 40mg /dL 5 956120 mg/dL    Total Basal  21.326.9 -> 26.5       Duration of insulin   3 hours       3. Patient education:  Reviewed Human resources officerpump/cgm download. Discussed use of accurate carb counts and food boluses when she boluses for high sugars . Discussed allowing the Control IQ to bolus for her.  4. Follow-up: 3 months. Order annual lab tests then.   Level of Service: This visit lasted in excess of 60 minutes today spent reviewing the medical chart, getting to know her, counseling the patient, and documenting today's encounter.  When a patient is on insulin, intensive monitoring of blood glucose levels is necessary to avoid hyperglycemia and hypoglycemia. Severe hyperglycemia/hypoglycemia can lead to hospital admissions and be life threatening.    Molli KnockMichael Veola Cafaro, MD, CDE Adult and Pediatric Endocrinology

## 2020-08-29 ENCOUNTER — Other Ambulatory Visit: Payer: Self-pay

## 2020-08-29 ENCOUNTER — Encounter (INDEPENDENT_AMBULATORY_CARE_PROVIDER_SITE_OTHER): Payer: Self-pay | Admitting: "Endocrinology

## 2020-08-29 ENCOUNTER — Ambulatory Visit (INDEPENDENT_AMBULATORY_CARE_PROVIDER_SITE_OTHER): Payer: BC Managed Care – PPO | Admitting: "Endocrinology

## 2020-08-29 VITALS — BP 110/68 | HR 88 | Ht 61.65 in | Wt 131.6 lb

## 2020-08-29 DIAGNOSIS — E11649 Type 2 diabetes mellitus with hypoglycemia without coma: Secondary | ICD-10-CM

## 2020-08-29 DIAGNOSIS — D500 Iron deficiency anemia secondary to blood loss (chronic): Secondary | ICD-10-CM | POA: Diagnosis not present

## 2020-08-29 DIAGNOSIS — E049 Nontoxic goiter, unspecified: Secondary | ICD-10-CM | POA: Diagnosis not present

## 2020-08-29 DIAGNOSIS — E109 Type 1 diabetes mellitus without complications: Secondary | ICD-10-CM

## 2020-08-29 LAB — POCT GLUCOSE (DEVICE FOR HOME USE): POC Glucose: 183 mg/dl — AB (ref 70–99)

## 2020-08-29 LAB — POCT GLYCOSYLATED HEMOGLOBIN (HGB A1C): Hemoglobin A1C: 8.8 % — AB (ref 4.0–5.6)

## 2020-08-29 NOTE — Progress Notes (Signed)
Subjective:  Patient Name: Alisha Church Date of Birth: 03/19/2000  MRN: [id number]  Alisha Church  presents to the office today for follow-up evaluation and management of her type 1 diabetes, growth delay, weight loss, and goiter  HISTORY OF PRESENT ILLNESS:   Alisha Church is a 20 y.o. African-American young lady.   Alisha Church was unaccompanied.   1. Alisha Church's initial pediatric endocrine consultation occurred on 07/24/2010 on an inpatient basis.    [facility]2010 for new-onset type 1 diabetes mellitus, dehydration, and ketonuria. She had had problems with polyuria, polydipsia, and thirst for several weeks prior to admission. She was initially quite dehydrated. Her blood glucose was 450. Her venous pH was 7.39. Her serum bicarbonate was 21. Her urine glucose was greater than 1000. Urine ketones were greater than 40. Hemoglobin A1c was 13.2%. Her insulin C-peptide was 0.52 (reference 0.80 to 3.9). Her GAD antibody was markedly elevated at >30. Her islet cell antibody was markedly elevated an >80. Her insulin autoantibodies were <0.1.   B. She was started on Lantus insulin as a basal insulin. She was also started on Novolog aspart insulin as her bolus insulin at mealtimes, at bedtime and at 2 AM as needed.   2. Clinical course;  A. She was transitioned to a Medtronic 530G insulin pump 07/25/2012.   B. She  transitioned to Tandem T-Slim/Dexcom with Control IQ on 08/12/2017.    3. The patient's last PSSG visit was on 04/18/20.  A. In the interim, she has been generally healthy.   B. She is on a Tandem T-Slim pump and Dexcom G6.   C. She is taking 1000 IU of vitamin D per day. She takes her iron supplement on most days each week.    3. Pertinent Review of Systems:  Constitutional: Alisha Church feels "pretty good". She has been healthy and active. Eyes: Vision seems to be good with her glasses/contacts. There are no recognized eye problems. Her  last eye exam was in June 2021.   Neck: She has some swollen anterior superior cervical nodes at times, but no frank thyroid inflammation.   Heart: Heart rate increases with exercise or other physical activity. She has no complaints of palpitations, irregular heart beats, chest pain, or chest pressure.   Lungs: No asthma or wheezing.   Gastrointestinal: Bowel movents seem normal. The patient has no complaints of excessive hunger, acid reflux, upset stomach, stomach aches or pains, bloating after meals, diarrhea, or constipation.  Hands: No problems Legs: Muscle mass and strength seem normal. There are no complaints of numbness, tingling, burning, or pain. No edema is noted.  Feet: There are no obvious foot problems. There are no complaints of numbness, tingling, burning, or pain. No edema is noted. Neurologic: There are no recognized problems with muscle movement and strength, sensation, or coordination. GYN: LMP was in early July 2022. Menses occur regularly. She is not on birth control.  Hypoglycemia: Not often Covid: not vaccinated yet  Diabetes ID: She has a tattoo on her inner right arm above the wrist. Annual Labs:  Done August 2021- mild anemia- now taking iron. No other issues. Due August 2022 Annual eye exam: Due now  4. CGM and pump download combination:    We have data for the past 4 weeks. CGM download:  A. Average SG 219, compared with 204 at her last visit. SG range 43->400, compared with 41->400 at her last visit.  B. Time in range is 38%.  Time above range is 61%. Time below range is 1%.  C. She was in Control IQ 86% or the time. I saw many instances where she did not bolus enough at meals or did not bolus for several hours after meals.  In general, SGs from midnight to 8 AM tend to be fairly good. SGs after lunch and dinner are often very high, especially when she eats high glycemic index foods. When she eats a large amount of carbs she often intentionally boluses less that  she thinks she should take in order to prevent later hypoglycemia.       PAST MEDICAL, FAMILY, AND SOCIAL HISTORY  Past Medical History:  Diagnosis Date   Dehydration    Diabetes mellitus    Diabetes mellitus without complication (HCC)    Phreesia 01/10/2020   Ketonuria    Type 1 diabetes mellitus not at goal [facility])     Family History  Problem Relation Age of Onset   Thyroid disease Mother    Diabetes Maternal Aunt    Heart disease Maternal Aunt    Hypothyroidism Maternal Grandmother    Thyroid disease Maternal Grandmother    Heart disease Maternal Grandmother    Diabetes Cousin      Current Outpatient Medications:    clindamycin (CLEOCIN T) 1 % external solution, APPLY TO ACNE EVERY DAY, Disp: , Rfl:    insulin aspart (NOVOLOG) 100 UNIT/ML injection, INJECT 300 UNITS INTO PUMP EVERY 48 HOURS AS DIRECTED, Disp: 30 mL, Rfl: 7   Multiple Vitamins-Minerals (WOMENS MULTIVITAMIN PO), Take by mouth., Disp: , Rfl:    tretinoin (RETIN-A) 0.025 % cream, APPLY TO FACE ONCE A DAY IN THE EVENING, Disp: , Rfl:    Glucagon (BAQSIMI TWO PACK) 3 MG/DOSE POWD, Place 1 each into the nose as needed (severe hypoglycmia with unresponsiveness). (Patient not taking: No sig reported), Disp: 1 each, Rfl: 3   glucagon 1 MG injection, Inject 1.0 mg into anterior thigh one time if unconscious, unresponsive, unable to swallow and or has seizure. (Patient not taking: Reported on 08/29/2020), Disp: 2 each, Rfl: 4   glucose blood (ONETOUCH VERIO) test strip, CHECK GLUCOSE 6 TIMES DAILY (Patient not taking: Reported on 08/29/2020), Disp: , Rfl:    OneTouch Delica Lancets 33G MISC, Use to check BG 10x day (Patient not taking: Reported on 08/29/2020), Disp: , Rfl:   Allergies as of 08/29/2020   (No Known Allergies)     reports that she has never smoked. She has never used smokeless tobacco. She reports that she does not drink alcohol and does not use drugs. 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    Resigned from [employer].   Home and family: She lives at home with her parents.  Activities: She had to resign from the [employer] on [facility]. due to conflicting demands with her classes at [employer] to become a Agricultural engineer. She will start classes at [employer] in August to become an Charity fundraiser.  Primary Care Provider: Iona Hansen, NP [facility] She does not use MJ, but does have an occasional margarita.   ROS: There are no other significant problems involving Amiria's other body systems.   Objective:  Vital Signs:    BP 110/68 (BP Location: Right Arm, Patient Position: Sitting)   Pulse 88   Ht  5' 1.65" (1.566 m)   Wt 131 lb 9.6 oz (59.7 kg)   LMP 07/30/2020 (Within Days)   BMI 24.34 kg/m  Growth percentile SmartLinks can only be used for patients less than 34 years old.  HR was 88.    Ht Readings from Last 3 Encounters:  08/29/20 5' 1.65" (1.566 m)  09/13/19 5' 0.67" (1.541 m)  01/30/19 5' 0.63" (1.54 m)   Wt Readings from Last 3 Encounters:  08/29/20 131 lb 9.6 oz (59.7 kg)  04/18/20 128 lb (58.1 kg)  12/11/19 128 lb (58.1 kg)   HC Readings from Last 3 Encounters:  No data found for [facility]   Body surface area is 1.61 meters squared. Facility age limit for growth percentiles is 20 years. Facility age limit for growth percentiles is 20 years.   PHYSICAL EXAM:   Constitutional: The patient appears healthy and well nourished. The patient's height and weight are normal for age. Her weight has increased 3 pounds since her last visit. Head: The head is normocephalic. Face: The face appears normal. There are no obvious dysmorphic features. Eyes: The eyes appear to be normally formed and spaced. Gaze is conjugate. There is no obvious arcus or proptosis. Moisture appears normal. Ears: The ears are normally placed and appear  externally normal. Mouth: The oropharynx and tongue appear normal. Dentition appears to be normal for age. Oral moisture is normal. Neck: The neck appears to be visibly normal. The thyroid gland is more enlarged at about 22+ grams in size. Both lobes are enlarged, with the left lobe being larger and firmer. The consistency of the thyroid gland is fairly firm. The thyroid gland is not tender to palpation. Lungs: No increased work of breathing Heart: Normal S1 and S2, no abnormal heart sounds or murmurs Abdomen: The abdomen appears to be normal in size for the patient's age. There is no obvious hepatomegaly, splenomegaly, or other mass effect.  Arms: Muscle size and bulk are normal for age. Hands: There is no obvious tremor. Phalangeal and metacarpophalangeal joints are normal. Palmar muscles are normal for age. Palmar skin is normal. Palmar moisture is also normal. Nail beds are pallid.  Legs: Muscles appear normal for age. No edema is present. Feet: Feet are normally formed. DP pulses are normal 1+.  Neurologic: Strength is normal for age in both the upper and lower extremities. Muscle tone is normal. Sensation to touch is normal in both the legs and feet.    LAB DATA:   Lab Results  Component Value Date   HGBA1C 8.8 (A) 08/29/2020   HGBA1C 8.6 (A) 04/18/2020   HGBA1C 8.9 (A) 12/11/2019     Results for orders placed or performed in visit on 08/29/20 (from the past 504 hour(s))  POCT Glucose (Device for Home Use)   Collection Time: 08/29/20  3:32 PM  Result Value Ref Range   Glucose Fasting, POC     POC Glucose 183 (A) 70 - 99 mg/dl  POCT glycosylated hemoglobin (Hb A1C)   Collection Time: 08/29/20  3:48 PM  Result Value Ref Range   Hemoglobin A1C 8.8 (A) 4.0 - 5.6 %   HbA1c POC (<> result, manual entry)     HbA1c, POC (prediabetic range)     HbA1c, POC (controlled diabetic range)     Labs 08/29/20: HbA1c 8.8%, CBG 183  Labs 04/18/20: HbA1c 8.6%, CBG 236  Labs 09/13/19: HbA1c  8.3%, CBG 200; TSH 1.39, free T4 0.9; CMP normal; CBC normal, except Hgb 10.4 (ref 11.7-15.5),  Hct 34.1 (ref 35-45), MCV 73.8 (ref 80-100), MCH 22.5 (ref 27-33), MCHC 30.5 (ref 32-36), RDW 15.8 (ref 11-15); cholesterol 146, triglycerides 35, HDL 78, LDL 58; urinary microalbumin/creatinine ratio 3   Assessment and Plan:   ASSESSMENT: Alisha Church is a 20 y.o. African-American young woman with type 1 diabetes managed on insulin pump   1. Type 1 diabetes on insulin pump:   A. Her HbA1C is higher that at her last visit and much higher than ADA goal of 7%  B. Glycemic control tends to worsen in the afternoons and evenings especially after high glycemic index meals.   C. BGs during the night are often much better.   D. We discussed being careful about carb counting and increased carb coverage. Whenever her BGs are unusually high or low, please take a minute to analyze why.   2. Hypoglycemia: She is not having many low BGs since Dr. Vanessa Inwood changed her basal rates at her visit in November 2021.   3. Goiter: Her thyroid gland is more enlarged today. She was euthyroid in August 2021. 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  4. Anemia: She needs to take her iron regularly.    PLAN:   1. Diagnostic: A1C and BG today as above. I ordered annual fasting surveillance tests. Continue home monitoring.  2. Therapeutic:   Change correction factors and BG targets as shown below.    Time Basal Rate Correction factor Carb ratio Target BG   12a 0.9 40 mg/dL 5 527 mg/dL  4a 1.2 40 mg/dL 5 782 mg/dL  6a   4.23  40 mg/dL 5 536 mg/dL  8a 1.1 35 mg/dL 4 144 mg/dL  1p   3.15 35 mg/dl 5 400 mg/dL  6p 1.1 35 mg/dL 5 867 mg/dL    Total Basal  61.95 units        Duration of insulin   3 hours       3. Patient education:  Reviewed Human resources officer. Discussed use of accurate carb counts and food boluses when she boluses for high sugars . Discussed allowing the Control IQ to bolus for her.  4. Follow-up: 3 months.    Level of Service: This visit lasted in excess of 60 minutes today spent reviewing the medical chart, getting to know her, counseling the patient, and documenting today's encounter.  When a patient is on insulin, intensive monitoring of blood glucose levels is necessary to avoid hyperglycemia and hypoglycemia. Severe hyperglycemia/hypoglycemia can lead to hospital admissions and be life threatening.    Molli Knock, MD, CDE Adult and Pediatric Endocrinology

## 2020-08-29 NOTE — Patient Instructions (Signed)
Follow up visit in 3 months. 

## 2020-11-01 ENCOUNTER — Telehealth (INDEPENDENT_AMBULATORY_CARE_PROVIDER_SITE_OTHER): Payer: Self-pay

## 2020-11-01 NOTE — Telephone Encounter (Signed)
Patient came to office for Dexcom sensor samples. Informed patient that we no longer have Dexcom sensors. She said that she is waiting on her order from her DME supplier , but has a meter and supplies for that to back her up until her sensors arrive.

## 2020-12-01 NOTE — Progress Notes (Signed)
Subjective:  Patient Name: Marilouise Daoust Date of Birth: [birth year]  MRN: [id number]  Kimiya Matott  presents to the office today for follow-up evaluation and management of her type 1 diabetes, growth delay, weight loss, and goiter  HISTORY OF PRESENT ILLNESS:   Antonya is a 20 y.o. African-American young lady.   Khayla was unaccompanied.   1. Naliah's initial pediatric endocrine consultation occurred on 07/24/2010 on an inpatient basis.    [name] was admitted to [facility]'s pediatric ward on 07/19/2010 for new-onset type 1 diabetes mellitus, dehydration, and ketonuria. She had had problems with polyuria, polydipsia, and thirst for several weeks prior to admission. She was initially quite dehydrated. Her blood glucose was 450. Her venous pH was 7.39. Her serum bicarbonate was 21. Her urine glucose was greater than 1000. Urine ketones were greater than 40. Hemoglobin A1c was 13.2%. Her insulin C-peptide was 0.52 (reference 0.80 to 3.9). Her GAD antibody was markedly elevated at >30. Her islet cell antibody was markedly elevated an >80. Her insulin autoantibodies were <0.1.   B. She was started on Lantus insulin as a basal insulin. She was also started on Novolog aspart insulin as her bolus insulin at mealtimes, at bedtime and at 2 AM as needed.   2. Clinical course;  A. She was transitioned to a Medtronic 530G insulin pump 07/25/2012.   B. She  transitioned to Tandem T-Slim/Dexcom with Control IQ on 08/12/2017.    3. The patient's last [facility] visit was on 08/29/20. I changed several correction factors and BG targets. Those changes helped a bit.   A. In the interim, she has been generally healthy.   B. She is on a Tandem T-Slim pump and Dexcom G6.   C. She is taking 1000 IU of vitamin D per day. She takes her iron supplement on most days each week.    3. Pertinent Review of Systems:  Constitutional: Riona feels "pretty good". She has been healthy and active. Eyes: Vision seems  to be good with her glasses/contacts. There are no recognized eye problems. Her last eye exam was in June 2021.  She needs to schedule a follow up exam.  Neck: She has not had any swollen anterior superior cervical nodes recently or any frank thyroid inflammation.   Heart: Heart rate increases with exercise or other physical activity. She has no complaints of palpitations, irregular heart beats, chest pain, or chest pressure.   Lungs: No asthma or wheezing.   Gastrointestinal: Bowel movents seem normal. The patient has no complaints of excessive hunger, acid reflux, upset stomach, stomach aches or pains, bloating after meals, diarrhea, or constipation.  Hands: No problems Legs: Muscle mass and strength seem normal. There are no complaints of numbness, tingling, burning, or pain. No edema is noted.  Feet: There are no obvious foot problems. There are no complaints of numbness, tingling, burning, or pain. No edema is noted. Neurologic: There are no recognized problems with muscle movement and strength, sensation, or coordination. GYN: LMP was on 11/18/20. Menses occur fairly regularly, with some variation in cycle length. She is not on birth control.  Hypoglycemia: Not often Covid: not vaccinated yet  Diabetes ID: She has a tattoo on her inner right arm above the wrist. Annual Labs:  Done August 2021- mild anemia- now taking iron. No other issues. Due August 2022 Annual eye exam: Due now  4. CGM and pump download combination:    We have data for the past 4 weeks. CGM download:  A. Average SG 232, compared with 219 at her last visit, and with 204 at her prior visit. SG range 48-400, compared with 43->400 at her last visit and with 41->400 at her prior visit.  B. Time in range is 33%, compared with 38% at her last visit. Time above range is 66%, compared with 61% at her last visit. Time below range is <1%, compared with 1% at her last visit.   C. She was in Control IQ 54% of the time, compared  with 86% at her last visit. She was off the pump-sensor combination for about three days. I saw many instances when she did not bolus enough at meals or did not bolus for several hours after meals. In general, she needs to try to be more accurate with carb counting and bolus on time. She also needs higher ICRs. SGs after lunch and dinner are often very high, especially when she eats high glycemic index foods. When she eats a large amount of carbs she often intentionally boluses less than she thinks she should take in order to prevent later hypoglycemia.       PAST MEDICAL, FAMILY, AND SOCIAL HISTORY  Past Medical History:  Diagnosis Date   Dehydration    Diabetes mellitus    Diabetes mellitus without complication (HCC)    Phreesia 01/10/2020   Ketonuria    Type 1 diabetes mellitus not at goal [facility])     Family History  Problem Relation Age of Onset   Thyroid disease Mother    Diabetes Maternal Aunt    Heart disease Maternal Aunt    Hypothyroidism Maternal Grandmother    Thyroid disease Maternal Grandmother    Heart disease Maternal Grandmother    Diabetes Cousin      Current Outpatient Medications:    benzoyl peroxide (CERAVE ACNE FOAMING CREAM) 4 % external liquid, 1 application, Disp: , Rfl:    glucose blood (ONETOUCH VERIO) test strip, , Disp: , Rfl:    insulin aspart (NOVOLOG) 100 UNIT/ML injection, INJECT 300 UNITS INTO PUMP EVERY 48 HOURS AS DIRECTED, Disp: 30 mL, Rfl: 7   Multiple Vitamins-Minerals (WOMENS MULTIVITAMIN PO), Take by mouth., Disp: , Rfl:    OneTouch Delica Lancets 33G MISC, , Disp: , Rfl:    tretinoin (RETIN-A) 0.025 % cream, APPLY TO FACE ONCE A DAY IN THE EVENING, Disp: , Rfl:    clindamycin (CLEOCIN T) 1 % external solution, APPLY TO ACNE EVERY DAY, Disp: , Rfl:    clindamycin (CLEOCIN T) 1 % lotion, Apply topically daily., Disp: , Rfl:    Glucagon (BAQSIMI TWO PACK) 3 MG/DOSE POWD, Place 1 each into the nose as needed (severe hypoglycmia with  unresponsiveness). (Patient not taking: No sig reported), Disp: 1 each, Rfl: 3  Allergies as of 12/02/2020   (No Known Allergies)     reports that she has never smoked. She has never used smokeless tobacco. She reports that she does not drink alcohol and does not use drugs. 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    Home and family: She lives at home with her parents.  Activities: She now works in [employer]. 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  Primary Care Provider: Iona Hansen, NP [facility] She drinks alcoholic beverages occasionally.  ROS: There are no other significant problems involving Gerturde's other body systems.   Objective:  Vital Signs:    BP 118/68   Pulse 96   Wt 129 lb 9.6 oz (58.8 kg)   LMP 11/18/2020   BMI 23.97 kg/m     Ht Readings from Last 3 Encounters:  08/29/20 5' 1.65" (1.566 m)  09/13/19 5' 0.67" (1.541 m)  01/30/19 5' 0.63" (1.54 m)   Wt Readings from Last 3 Encounters:  12/02/20 129 lb 9.6 oz (58.8 kg)  08/29/20 131 lb 9.6 oz (59.7 kg)  04/18/20 128 lb (58.1 kg)   HC Readings from Last 3 Encounters:  No data found for [facility]   Body surface area is 1.6 meters squared. Facility age limit for growth percentiles is 20 years. Facility age limit for growth percentiles is 20 years.   PHYSICAL EXAM:   Constitutional: The patient appears healthy and well nourished. The patient's height and weight are normal for age. Her weight has decreased 2 pounds since her last visit. She is very bright and alert.  Head: The head is normocephalic. Face: The face appears normal. There are no obvious dysmorphic features. Eyes: The eyes appear to be normally formed and spaced. Gaze is conjugate. There is no obvious arcus or proptosis. Moisture appears  normal. Ears: The ears are normally placed and appear externally normal. Mouth: The oropharynx and tongue appear normal. Dentition appears to be normal for age. Oral moisture is normal. Neck: The neck appears to be visibly normal. The thyroid gland is again enlarged, but perhaps a bit smaller, at about 22 grams in size. Today the lobes are symmetrically enlarged.  The consistency of the thyroid gland is full bilaterally. The thyroid gland is not tender to palpation. Lungs: No increased work of breathing Heart: Normal S1 and S2, no abnormal heart sounds or murmurs Abdomen: The abdomen appears to be normal in size for the patient's age. There is no obvious hepatomegaly, splenomegaly, or other mass effect.  Arms: Muscle size and bulk are normal for age. Hands: There is no obvious tremor. Phalangeal and metacarpophalangeal joints are normal. Palmar muscles are normal for age. Palmar skin is normal. Palmar moisture is also normal. Nail beds are mildly pallid.  Legs: Muscles appear normal for age. No edema is present. Feet: Feet are normally formed. DP pulses are normal 1+.  Neurologic: Strength is normal for age in both the upper and lower extremities. Muscle tone is normal. Sensation to touch is normal in both the legs and feet.    LAB DATA:   Lab Results  Component Value Date   HGBA1C 8.6 (A) 12/02/2020   HGBA1C 8.8 (A) 08/29/2020   HGBA1C 8.6 (A) 04/18/2020     Results for orders placed or performed in visit on 12/02/20 (from the past 504 hour(s))  POCT Glucose (Device for Home Use)   Collection Time: 12/02/20  2:53 PM  Result Value Ref Range   Glucose Fasting, POC     POC Glucose 161 (A) 70 - 99 mg/dl  POCT glycosylated hemoglobin (Hb A1C)   Collection Time: 12/02/20  3:00 PM  Result Value Ref Range   Hemoglobin A1C 8.6 (A) 4.0 - 5.6 %   HbA1c POC (<> result, manual entry)     HbA1c, POC (prediabetic range)     HbA1c, POC (controlled diabetic range)     Labs 12/02/20: HbA1c 8.6%,  CBG 161  Labs 08/29/20: HbA1c 8.8%, CBG 183  Labs 04/18/20: HbA1c 8.6%, CBG 236  Labs 09/13/19: HbA1c 8.3%, CBG 200; TSH 1.39, free T4 0.9; CMP normal; CBC normal, except Hgb 10.4 (ref 11.7-15.5), Hct 34.1 (ref 35-45), MCV 73.8 (ref 80-100), MCH 22.5 (ref 27-33), MCHC 30.5 (ref 32-36), RDW 15.8 (ref 11-15); cholesterol 146, triglycerides 35, HDL 78, LDL 58; urinary microalbumin/creatinine ratio 3   Assessment and Plan:   ASSESSMENT: Ozelle is a 20 y.o. African-American young woman with type 1 diabetes managed on insulin pump   1. Type 1 diabetes on insulin pump:   A. Her HbA1C is lower than at her last visit, but still much higher than ADA goal of 7%  B. Glycemic control tends to worsen in the afternoons and evenings especially after high glycemic index meals.   C. BGs during the night are usually much better, unless she over-corrects for a lower SG at bedtime. .   D. We discussed being careful about carb counting and increased carb coverage. Whenever her BGs are unusually high or low, please take time to analyze why.   2. Hypoglycemia: She has had 4 low BGs, three of which occurred between 5-7 AM.   3. Goiter: Her thyroid gland is a bit less enlarged today. She was euthyroid in August 2021. 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  4. Pallor/Anemia: She needs to take her iron regularly.    PLAN:   1. Diagnostic: A1C and BG today as above. I ordered annual fasting surveillance tests. Continue home monitoring.  2. Therapeutic:   Change insulin/carb ratios as shown below.    Time Basal Rate Correction factor Carb ratio Target BG   12a 0.9 40 mg/dL 5 867 mg/dL  4a 1.2 40 mg/dL 5 672 mg/dL  6a   0.94  40 mg/dL 5 709 mg/dL  8a 1.1 35 mg/dL 4 628 mg/dL  1p   3.66 35 mg/dl 5->4 294 mg/dL  6p 1.1 35 mg/dL 5->4 765 mg/dL  9p  1.1 35 mg/dL            5    Duration of insulin   3 hours       3. Patient education:  Reviewed Human resources officer. Discussed use of accurate carb counts  and food boluses when she boluses for high sugars . Discussed allowing the Control IQ to bolus for her.  4. Follow-up: 3 months.   Level of Service: This visit lasted in excess of 60 minutes today spent reviewing the medical chart, getting to know her, counseling the patient, and documenting today's encounter.  When a patient is on insulin, intensive monitoring of blood glucose levels is necessary to avoid hyperglycemia and hypoglycemia. Severe hyperglycemia/hypoglycemia can lead to hospital admissions and be life threatening.    Molli Knock, MD, CDE Adult and Pediatric Endocrinology

## 2020-12-02 ENCOUNTER — Encounter (INDEPENDENT_AMBULATORY_CARE_PROVIDER_SITE_OTHER): Payer: Self-pay | Admitting: "Endocrinology

## 2020-12-02 ENCOUNTER — Other Ambulatory Visit: Payer: Self-pay

## 2020-12-02 ENCOUNTER — Ambulatory Visit (INDEPENDENT_AMBULATORY_CARE_PROVIDER_SITE_OTHER): Payer: BC Managed Care – PPO | Admitting: "Endocrinology

## 2020-12-02 VITALS — BP 118/68 | HR 96 | Wt 129.6 lb

## 2020-12-02 DIAGNOSIS — R231 Pallor: Secondary | ICD-10-CM | POA: Diagnosis not present

## 2020-12-02 DIAGNOSIS — E10649 Type 1 diabetes mellitus with hypoglycemia without coma: Secondary | ICD-10-CM | POA: Diagnosis not present

## 2020-12-02 DIAGNOSIS — E049 Nontoxic goiter, unspecified: Secondary | ICD-10-CM

## 2020-12-02 DIAGNOSIS — E109 Type 1 diabetes mellitus without complications: Secondary | ICD-10-CM

## 2020-12-02 LAB — POCT GLUCOSE (DEVICE FOR HOME USE): POC Glucose: 161 mg/dl — AB (ref 70–99)

## 2020-12-02 LAB — POCT GLYCOSYLATED HEMOGLOBIN (HGB A1C): Hemoglobin A1C: 8.6 % — AB (ref 4.0–5.6)

## 2020-12-02 NOTE — Patient Instructions (Signed)
Follow up visit in 3 months. 

## 2021-03-03 NOTE — Progress Notes (Signed)
Subjective:  Patient Name: Alisha Church Date of Birth: 2000-06-04  MRN: [id number]  Alisha Church  presents to the office today for follow-up evaluation and management of her type 1 diabetes, hypoglycemia, goiter, and pallor.   HISTORY OF PRESENT ILLNESS:   Alisha Church is a 21 y.o. African-American young lady.   Alisha Church was unaccompanied.   1. Alisha Church's initial pediatric endocrine consultation occurred on 07/24/2010 on an inpatient basis.    Alisha Church was admitted to [facility] pediatric ward on 07/19/2010 for new-onset type 1 diabetes mellitus, dehydration, and ketonuria. She had had problems with polyuria, polydipsia, and thirst for several weeks prior to admission. She was initially quite dehydrated. Her blood glucose was 450. Her venous pH was 7.39. Her serum bicarbonate was 21. Her urine glucose was greater than 1000. Urine ketones were greater than 40. Hemoglobin A1c was 13.2%. Her insulin C-peptide was 0.52 (reference 0.80 to 3.9). Her GAD antibody was markedly elevated at >30. Her islet cell antibody was markedly elevated an >80. Her insulin autoantibodies were <0.1.   B. She was started on Lantus insulin as a basal insulin. She was also started on Novolog aspart insulin as her bolus insulin at mealtimes, at bedtime and at 2 AM as needed.   2. Clinical course;  A. She was transitioned to a Medtronic 530G insulin pump 07/25/2012.   B. She  transitioned to Tandem T-Slim/Dexcom with Control IQ on 08/12/2017.    3. The patient's last [facility] visit was on 12/02/20. I changed two carb ratios. Those changes helped a bit. I ordered annual surveillance lab tests, but they have not yet been done.   A. In the interim, she has been generally healthy.   B. She is on a Tandem T-Slim pump and Dexcom G6. They are working well.   C. She is taking 1000 IU of vitamin D per day. She takes her iron supplement every day.     4. Pertinent Review of Systems:  Constitutional: Alisha Church feels "pretty  good". She has been healthy and active. Some days she has more energy than others. Eyes: Vision seems to be good with her glasses/contacts. There are no recognized eye problems. Her last eye exam was in June 2021.  She needs to schedule a follow up exam.  Neck: She has not had any frank thyroid inflammation.   Heart: Heart rate increases with exercise or other physical activity. She has no complaints of palpitations, irregular heart beats, chest pain, or chest pressure.   Lungs: No asthma or wheezing.   Gastrointestinal: Bowel movents seem normal. The patient has no complaints of excessive hunger, acid reflux, upset stomach, stomach aches or pains, bloating after meals, diarrhea, or constipation.  Hands: No problems Legs: Muscle mass and strength seem normal. There are no complaints of numbness, tingling, burning, or pain. No edema is noted.  Feet: There are no obvious foot problems. There are no complaints of numbness, tingling, burning, or pain. No edema is noted. Neurologic: There are no recognized problems with muscle movement and strength, sensation, or coordination. GYN: LMP was on 02/05/20. Menses occur fairly regularly, with some variation in cycle length. She is not on birth control.  Hypoglycemia: Not often Covid: She is fully vaccinated now.  Diabetes ID: She has a tattoo on her inner right arm above the wrist. Annual Labs:  Done August 2021- mild anemia- now taking iron. Due in 2022, will do soon in 2023 Annual eye exam: overdue now  5. BG and pump download:  We have data for the past two weeks.   A. Average BG is 184, range 41-400.   B. She occasionally forgets to bolus, boluses late, of does not bolus enough.   6. CGM and pump download combination:  We have data for the past 14 days. A. Average SG is 184, compared with 232 at her last visit, and with 219 at her prior visit. SG range is about 40->400, compared with 48-400 at her last visit and with 43->400 at her prior visit.  B.  Time in range is 54%, compared with 33% at her last visit. Time above range is 43%, compared with 66% at her last visit. Time below range is <1%, compared with <1% at her last visit.   C. She was in Control IQ 82% of the time, compared with 54% at her last visit. I saw several instances when she did not bolus enough at meals or did not bolus for several hours after meals. In general, she needs to try to be more accurate with carb counting and bolus on time. SGs after lunch and dinner are often very high, especially when she eats high glycemic index foods. When she eats a large amount of carbs she sometimes intentionally boluses less than she thinks she should take in order to prevent later hypoglycemia.       PAST MEDICAL, FAMILY, AND SOCIAL HISTORY  Past Medical History:  Diagnosis Date   Dehydration    Diabetes mellitus    Diabetes mellitus without complication (HCC)    Phreesia 01/10/2020   Ketonuria    Type 1 diabetes mellitus not at goal [facility])     Family History  Problem Relation Age of Onset   Thyroid disease Mother    Diabetes Maternal Aunt    Heart disease Maternal Aunt    Hypothyroidism Maternal Grandmother    Thyroid disease Maternal Grandmother    Heart disease Maternal Grandmother    Diabetes Cousin      Current Outpatient Medications:    insulin aspart (NOVOLOG) 100 UNIT/ML injection, INJECT 300 UNITS INTO PUMP EVERY 48 HOURS AS DIRECTED, Disp: 30 mL, Rfl: 7   benzoyl peroxide (CERAVE ACNE FOAMING CREAM) 4 % external liquid, 1 application (Patient not taking: Reported on 03/04/2021), Disp: , Rfl:    clindamycin (CLEOCIN T) 1 % external solution, APPLY TO ACNE EVERY DAY (Patient not taking: Reported on 03/04/2021), Disp: , Rfl:    clindamycin (CLEOCIN T) 1 % lotion, Apply topically daily. (Patient not taking: Reported on 03/04/2021), Disp: , Rfl:    Glucagon (BAQSIMI TWO PACK) 3 MG/DOSE POWD, Place 1 each into the nose as needed (severe hypoglycmia with unresponsiveness).  (Patient not taking: Reported on 05/10/2018), Disp: 1 each, Rfl: 3   glucose blood (ONETOUCH VERIO) test strip, , Disp: , Rfl:    Multiple Vitamins-Minerals (WOMENS MULTIVITAMIN PO), Take by mouth. (Patient not taking: Reported on 03/04/2021), Disp: , Rfl:    OneTouch Delica Lancets 33G MISC, , Disp: , Rfl:    tretinoin (RETIN-A) 0.025 % cream, APPLY TO FACE ONCE A DAY IN THE EVENING (Patient not taking: Reported on 03/04/2021), Disp: , Rfl:   Allergies as of 03/04/2021   (No Known Allergies)     reports that she has never smoked. She has never used smokeless tobacco. She reports that she does not drink alcohol and does not use drugs. 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    [place] [employer]    Home and family: She lives at home with her parents.  Activities: She works in [employer]. 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  Primary Care Provider: Berkley Harvey, NP [facility] She drinks alcoholic beverages occasionally.    ROS: There are no other significant problems involving Alisha Church's other body systems.   Objective:  Vital Signs:    BP 110/68 (BP Location: Right Arm, Patient Position: Sitting, Cuff Size: Normal)    Pulse 70    Wt 131 lb 12.8 oz (59.8 kg)    BMI 24.38 kg/m     Ht Readings from Last 3 Encounters:  08/29/20 5' 1.65" (1.566 m)  09/13/19 5' 0.67" (1.541 m)  01/30/19 5' 0.63" (1.54 m)   Wt Readings from Last 3 Encounters:  03/04/21 131 lb 12.8 oz (59.8 kg)  12/02/20 129 lb 9.6 oz (58.8 kg)  08/29/20 131 lb 9.6 oz (59.7 kg)   HC Readings from Last 3 Encounters:  No data found for [facility]   Body surface area is 1.61 meters squared. Facility age limit for growth percentiles is 20 years. Facility age limit for growth percentiles is 20 years.   PHYSICAL EXAM:   Constitutional: The  patient appears healthy and well nourished. The patient's height and weight are normal for age. Her weight has increased 2 pounds since her last visit. She is very bright and alert.  Head: The head is normocephalic. Face: The face appears normal. There are no obvious dysmorphic features. Eyes: The eyes appear to be normally formed and spaced. Gaze is conjugate. There is no obvious arcus or proptosis. Moisture appears normal. Ears: The ears are normally placed and appear externally normal. Mouth: The oropharynx and tongue appear normal. Dentition appears to be normal for age. Oral moisture is normal. Neck: The neck appears to be visibly normal. The thyroid gland is again enlarged at about 22 grams in size. Today the left lobe is much larger then the right. The consistency of the thyroid gland is full in the left lobe. The thyroid gland is not tender to palpation. Lungs: No increased work of breathing Heart: Normal S1 and S2, no abnormal heart sounds or murmurs Abdomen: The abdomen appears to be normal in size for the patient's age. There is no obvious hepatomegaly, splenomegaly, or other mass effect.  Arms: Muscle size and bulk are normal for age. Hands: There is no obvious tremor. Phalangeal and metacarpophalangeal joints are normal. Palmar muscles are normal for age. Palmar skin is normal. Palmar moisture is also normal. Nail polish precludes assessing nail beds for pallor.  Legs: Muscles appear normal for age. No edema is present. Feet: Feet are normally formed. DP pulses are normal 1+.  Neurologic: Strength is normal for age in both the upper and lower extremities. Muscle tone is normal. Sensation to touch is normal in both the legs and feet.    LAB DATA:   Lab Results  Component Value Date   HGBA1C 8.1 (A) 03/04/2021   HGBA1C 8.6 (A) 12/02/2020   HGBA1C 8.8 (A) 08/29/2020     Results for orders placed or performed in visit on 03/04/21 (from the past 504 hour(s))  POCT Glucose (Device  for Home Use)   Collection Time: 03/04/21  3:23 PM  Result Value Ref Range   Glucose Fasting, POC     POC Glucose 176 (  A) 70 - 99 mg/dl  POCT glycosylated hemoglobin (Hb A1C)   Collection Time: 03/04/21  3:27 PM  Result Value Ref Range   Hemoglobin A1C 8.1 (A) 4.0 - 5.6 %   HbA1c POC (<> result, manual entry)     HbA1c, POC (prediabetic range)     HbA1c, POC (controlled diabetic range)     Labs 03/04/21: HbA1c 8.1%; CBG 176  Labs 12/02/20: HbA1c 8.6%, CBG 161  Labs 08/29/20: HbA1c 8.8%, CBG 183  Labs 04/18/20: HbA1c 8.6%, CBG 236  Labs 09/13/19: HbA1c 8.3%, CBG 200; TSH 1.39, free T4 0.9; CMP normal; CBC normal, except Hgb 10.4 (ref 11.7-15.5), Hct 34.1 (ref 35-45), MCV 73.8 (ref 80-100), MCH 22.5 (ref 27-33), MCHC 30.5 (ref 32-36), RDW 15.8 (ref 11-15); cholesterol 146, triglycerides 35, HDL 78, LDL 58; urinary microalbumin/creatinine ratio 3   Assessment and Plan:   ASSESSMENT: Alisha Church is a 21 y.o. African-American young woman with type 1 diabetes managed on insulin pump   1. Type 1 diabetes on insulin pump:   A. Her HbA1C is lower than at her last visit, but still much higher than ADA goal of 7%  B. Glycemic control tends to worsen in the afternoons and evenings especially after high glycemic index meals.   C. BGs during the night are usually much better, unless she over-corrects for a lower SG at bedtime. .   D. We discussed being careful about carb counting and increased carb coverage. Whenever her BGs are unusually high or low, please take time to analyze why.  2. Hypoglycemia: She has had a few low BGs during the night and again in the late afternoons and evenings.  3. Goiter: Her thyroid gland again enlarged, but the lobes have shifted in size again. She was euthyroid in August 2021. 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 4. Pallor/Anemia: She needs to take her iron regularly.   PLAN:   1. Diagnostic: A1C and BG today as above. I ordered annual fasting surveillance  tests. Continue home monitoring.  2. Therapeutic:  Continue current pump settings.    Time Basal Rate Correction factor Carb ratio Target BG   12a 0.9 40 mg/dL 5 150 mg/dL  4a 1.2 40 mg/dL 5 150 mg/dL  6a   1.35  40 mg/dL 5 110 mg/dL  8a 1.1 35 mg/dL 4 110 mg/dL  1p   1.15 35 mg/dl 4 110 mg/dL  6p 1.1 35 mg/dL 4 120 mg/dL  9p  1.1 35 mg/dL            5    Duration of insulin   3 hours       3. Patient education:  Reviewed Primary school teacher. Discussed use of accurate carb counts and food boluses when she boluses for high sugars . Discussed allowing the Control IQ to bolus for her.  4. Follow-up: 3 months.   Level of Service: This visit lasted in excess of 55 minutes today spent reviewing the medical chart, getting to know her, counseling the patient, and documenting today's encounter.  When a patient is on insulin, intensive monitoring of blood glucose levels is necessary to avoid hyperglycemia and hypoglycemia. Severe hyperglycemia/hypoglycemia can lead to hospital admissions and be life threatening.    Tillman Sers, MD, CDE Adult and Pediatric Endocrinology

## 2021-03-04 ENCOUNTER — Other Ambulatory Visit: Payer: Self-pay

## 2021-03-04 ENCOUNTER — Encounter (INDEPENDENT_AMBULATORY_CARE_PROVIDER_SITE_OTHER): Payer: Self-pay | Admitting: "Endocrinology

## 2021-03-04 ENCOUNTER — Ambulatory Visit (INDEPENDENT_AMBULATORY_CARE_PROVIDER_SITE_OTHER): Payer: BC Managed Care – PPO | Admitting: "Endocrinology

## 2021-03-04 VITALS — BP 110/68 | HR 70 | Wt 131.8 lb

## 2021-03-04 DIAGNOSIS — E1165 Type 2 diabetes mellitus with hyperglycemia: Secondary | ICD-10-CM

## 2021-03-04 DIAGNOSIS — E11649 Type 2 diabetes mellitus with hypoglycemia without coma: Secondary | ICD-10-CM

## 2021-03-04 DIAGNOSIS — E063 Autoimmune thyroiditis: Secondary | ICD-10-CM | POA: Diagnosis not present

## 2021-03-04 DIAGNOSIS — E0865 Diabetes mellitus due to underlying condition with hyperglycemia: Secondary | ICD-10-CM

## 2021-03-04 DIAGNOSIS — E049 Nontoxic goiter, unspecified: Secondary | ICD-10-CM | POA: Diagnosis not present

## 2021-03-04 LAB — POCT GLUCOSE (DEVICE FOR HOME USE): POC Glucose: 176 mg/dl — AB (ref 70–99)

## 2021-03-04 LAB — POCT GLYCOSYLATED HEMOGLOBIN (HGB A1C): Hemoglobin A1C: 8.1 % — AB (ref 4.0–5.6)

## 2021-03-04 NOTE — Patient Instructions (Signed)
Follow up visit in 3 months. Please fast after 8 PM, except for water, on the night prior to having lab tests drawn.   At [facility], we are committed to providing exceptional care. You will receive a patient satisfaction survey through text or email regarding your visit today. Your opinion is important to me. Comments are appreciated.

## 2021-03-12 LAB — COMPREHENSIVE METABOLIC PANEL
AG Ratio: 1.4 (calc) (ref 1.0–2.5)
ALT: 9 U/L (ref 6–29)
AST: 12 U/L (ref 10–30)
Albumin: 4.3 g/dL (ref 3.6–5.1)
Alkaline phosphatase (APISO): 96 U/L (ref 31–125)
BUN: 9 mg/dL (ref 7–25)
CO2: 24 mmol/L (ref 20–32)
Calcium: 9.5 mg/dL (ref 8.6–10.2)
Chloride: 106 mmol/L (ref 98–110)
Creat: 0.68 mg/dL (ref 0.50–0.96)
Globulin: 3 g/dL (calc) (ref 1.9–3.7)
Glucose, Bld: 92 mg/dL (ref 65–99)
Potassium: 4 mmol/L (ref 3.5–5.3)
Sodium: 140 mmol/L (ref 135–146)
Total Bilirubin: 0.3 mg/dL (ref 0.2–1.2)
Total Protein: 7.3 g/dL (ref 6.1–8.1)

## 2021-03-12 LAB — T3, FREE: T3, Free: 3.3 pg/mL (ref 2.3–4.2)

## 2021-03-12 LAB — MICROALBUMIN / CREATININE URINE RATIO
Creatinine, Urine: 115 mg/dL (ref 20–275)
Microalb Creat Ratio: 3 mcg/mg creat (ref <30)
Microalb, Ur: 0.4 mg/dL

## 2021-03-12 LAB — T4, FREE: Free T4: 1 ng/dL (ref 0.8–1.8)

## 2021-03-12 LAB — LIPID PANEL
Cholesterol: 138 mg/dL (ref <200)
HDL: 69 mg/dL (ref >=50)
LDL Cholesterol (Calc): 59 mg/dL (calc)
Non-HDL Cholesterol (Calc): 69 mg/dL (calc) (ref <130)
Total CHOL/HDL Ratio: 2 (calc) (ref <5.0)
Triglycerides: 35 mg/dL (ref <150)

## 2021-03-12 LAB — TSH: TSH: 3.06 mIU/L

## 2021-04-05 ENCOUNTER — Other Ambulatory Visit (INDEPENDENT_AMBULATORY_CARE_PROVIDER_SITE_OTHER): Payer: Self-pay | Admitting: "Endocrinology

## 2021-06-03 ENCOUNTER — Telehealth (INDEPENDENT_AMBULATORY_CARE_PROVIDER_SITE_OTHER): Payer: Self-pay | Admitting: "Endocrinology

## 2021-06-03 NOTE — Telephone Encounter (Signed)
?  Name of who is calling: ?Mariposa ?Caller's Relationship to Patient: ?Self ?Best contact number: ?[phone] ?Provider they see: ?Dr. Fransico Michael ? ?Reason for call: ? ?Bama came into to see if she can get a insulin Pump. ? ? ?PRESCRIPTION REFILL ONLY ? ?Name of prescription: ? ?Pharmacy: ? ? ?

## 2021-06-04 NOTE — Telephone Encounter (Signed)
Pt came in requesting a copy of her pump settings. I printed out the last office visit note that contained the setting and gave to the pt. 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 I told her that if she needs any help getting anything to let us know. She stated understanding and had no further questions. ?

## 2021-06-18 NOTE — Progress Notes (Signed)
Subjective:  Patient Name: Alisha Church Date of Birth: October 19, 2000  MRN: [id number]  Alisha Church  presents to the office today for follow-up evaluation and management of her type 1 diabetes, hypoglycemia, goiter, and pallor.   HISTORY OF PRESENT ILLNESS:   Alisha Church is a 21 y.o. African-American young lady.   Alisha Church was unaccompanied.   1. Alisha Church's initial pediatric endocrine consultation occurred on 07/24/2010 on an inpatient basis.    Alisha Church was admitted to [facility]'s pediatric ward on 07/19/2010 for new-onset type 1 diabetes mellitus, dehydration, and ketonuria. Alisha Church had had problems with polyuria, polydipsia, and thirst for several weeks prior to admission. Alisha Church was initially quite dehydrated. Her blood glucose was 450. Her venous pH was 7.39. Her serum bicarbonate was 21. Her urine glucose was greater than 1000. Urine ketones were greater than 40. Hemoglobin A1c was 13.2%. Her insulin C-peptide was 0.52 (reference 0.80 to 3.9). Her GAD antibody was markedly elevated at >30. Her islet cell antibody was markedly elevated an >80. Her insulin autoantibodies were <0.1.   B. Alisha Church was started on Lantus insulin as a basal insulin. Alisha Church was also started on Novolog aspart insulin as her bolus insulin at mealtimes, at bedtime and at 2 AM as needed.   2. Clinical course;  A. Alisha Church was transitioned to a Medtronic 530G insulin pump 07/25/2012.   B. Alisha Church  transitioned to Tandem T-Slim/Dexcom with Control IQ on 08/12/2017.    3. The patient's last [facility] visit was on 03/04/21. I continued her pump settings.  I again ordered annual surveillance lab tests.   A. In the interim, Alisha Church has been healthy, but her allergies are acting up today. Alisha Church took several different allergy medications last night and again today.   B. Alisha Church is on a Tandem T-Slim pump and Dexcom G6. They are working well. Alisha Church needed a pump replacement last week.   C. Alisha Church is taking 1000 IU of vitamin D per day. Alisha Church takes her iron  supplement every day.     4. Pertinent Review of Systems:  Constitutional: Alisha Church feels "pretty good". Alisha Church has been healthy and active. Her energy level still varies.  Eyes: Vision seems to be good with her glasses/contacts. There are no recognized eye problems. Her last eye exam was in June 2021.  Alisha Church schedule a follow up exam on 07/01/21.  Neck: Alisha Church has not had any frank thyroid inflammation.   Heart: Heart rate increases with exercise or other physical activity. Alisha Church has no complaints of palpitations, irregular heart beats, chest pain, or chest pressure.   Lungs: No asthma or wheezing.   Gastrointestinal: Bowel movents seem normal. The patient has no complaints of excessive hunger, acid reflux, upset stomach, stomach aches or pains, bloating after meals, diarrhea, or constipation.  Hands: No problems Legs: Muscle mass and strength seem normal. There are no complaints of numbness, tingling, burning, or pain. No edema is noted.  Feet: There are no obvious foot problems. There are no complaints of numbness, tingling, burning, or pain. No edema is noted. Neurologic: There are no recognized problems with muscle movement and strength, sensation, or coordination. GYN: LMP was on 05/23/21. Menses occur fairly regularly, with some variation in cycle length. Alisha Church is not on birth control.  Hypoglycemia: Not often Covid: Alisha Church is fully vaccinated now.  Diabetes ID: Alisha Church has a tattoo on her inner right arm above the wrist. Annual Labs:  Done 03/11/21 Annual eye exam: due 07/01/21.   5. BG and pump download: We  have data for the past three weeks.   A. Average BG is 248. BG range 143-400 Alisha Church boluses 3-6 times per day.   B. Alisha Church occasionally forgets to bolus, boluses late, or does not bolus enough.   6. CGM and pump download combination:  We have data for the past 21 days. A. Average SG is 229, compared with 184 at her last visit, and with 222 at her prior visit. SG range is 48-400, compared with 40->-400 at her  last visit and with 43->400 at her prior visit.  B. Time in range is 54%, compared with 54%% at her last visit and with 33% at her prior visit.  Time above range is 46%, compared with 43% at her last visit. Time below range is 1%, compared with <1% at her last visit.   C. Alisha Church was in Control IQ 70% of the time, compared with 82% at her last visit. I saw many instances when Alisha Church did not bolus enough at meals or late at night and did not bolus for several hours after meals. In general, Alisha Church needs to try to be more accurate with carb counting and bolus on time. SGs after lunch and dinner are often very high, especially when Alisha Church eats high glycemic index foods. When Alisha Church eats a large amount of carbs Alisha Church still sometimes intentionally boluses less than Alisha Church thinks Alisha Church should take in order to prevent later hypoglycemia.       PAST MEDICAL, FAMILY, AND SOCIAL HISTORY  Past Medical History:  Diagnosis Date   Dehydration    Diabetes mellitus    Diabetes mellitus without complication (HCC)    Phreesia 01/10/2020   Ketonuria    Type 1 diabetes mellitus not at goal [facility])     Family History  Problem Relation Age of Onset   Thyroid disease Mother    Diabetes Maternal Aunt    Heart disease Maternal Aunt    Hypothyroidism Maternal Grandmother    Thyroid disease Maternal Grandmother    Heart disease Maternal Grandmother    Diabetes Cousin      Current Outpatient Medications:    clindamycin (CLEOCIN T) 1 % external solution, , Disp: , Rfl:    NOVOLOG 100 UNIT/ML injection, INJECT 300 UNITS INTO PUMP EVERY 48 HOURS AS DIRECTED, Disp: 30 mL, Rfl: 7   benzoyl peroxide (CERAVE ACNE FOAMING CREAM) 4 % external liquid, , Disp: , Rfl:    Glucagon (BAQSIMI TWO PACK) 3 MG/DOSE POWD, Place 1 each into the nose as needed (severe hypoglycmia with unresponsiveness). (Patient not taking: Reported on 05/10/2018), Disp: 1 each, Rfl: 3   glucose blood (ONETOUCH VERIO) test strip, , Disp: , Rfl:    Multiple Vitamins-Minerals  (WOMENS MULTIVITAMIN PO), Take by mouth. (Patient not taking: Reported on 03/04/2021), Disp: , Rfl:    OneTouch Delica Lancets 33G MISC, , Disp: , Rfl:    tretinoin (RETIN-A) 0.025 % cream, APPLY TO FACE ONCE A DAY IN THE EVENING (Patient not taking: Reported on 03/04/2021), Disp: , Rfl:   Allergies as of 06/19/2021   (No Known Allergies)     reports that Alisha Church has never smoked. Alisha Church has never used smokeless tobacco. Alisha Church reports that Alisha Church does not drink alcohol and does not use drugs. 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    Home  and family: Alisha Church lives at home with her parents.  Activities: Alisha Church works in [employer]. Alisha Church attends classes at [employer]. 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  Primary Care Provider: Iona HansenJones, Penny L, NP [facility] [place] Alisha Church drinks alcoholic beverages occasionally.    ROS: There are no other significant problems involving Alisha Church other body systems.   Objective:  Vital Signs:    BP 128/70 (BP Location: Right Arm, Patient Position: Sitting, Cuff Size: Large)   Pulse (!) 112   Ht 5' 1.54" (1.563 m)   Wt 129 lb 9.6 oz (58.8 kg)   LMP 05/23/2021 (Exact Date)   BMI 24.06 kg/m  Repeat HR was 104.    Ht Readings from Last 3 Encounters:  06/19/21 5' 1.54" (1.563 m)  08/29/20 5' 1.65" (1.566 m)  09/13/19 5' 0.67" (1.541 m)   Wt Readings from Last 3 Encounters:  06/19/21 129 lb 9.6 oz (58.8 kg)  03/04/21 131 lb 12.8 oz (59.8 kg)  12/02/20 129 lb 9.6 oz (58.8 kg)   HC Readings from Last 3 Encounters:  No data found for Alisha Church   Body surface area is 1.6 meters squared. Facility age limit for growth percentiles is 20 years. Facility age limit for growth percentiles is 20 years.   PHYSICAL EXAM:   Constitutional: The patient appears healthy and well nourished. The patient's height and weight are  normal for age. Her weight has decreased 2 pounds since her last visit. Her BMI is in the normal weight zone. Alisha Church is very bright and alert.  Head: The head is normocephalic. Face: The face appears normal. There are no obvious dysmorphic features. Eyes: The eyes appear to be normally formed and spaced. Gaze is conjugate. There is no obvious arcus or proptosis. Moisture appears normal. Ears: The ears are normally placed and appear externally normal. Mouth: The oropharynx and tongue appear normal. Dentition appears to be normal for age. Oral moisture is normal. Neck: The neck appears to be visibly enlarged. The thyroid gland is again enlarged at about 22 grams in size. Today the lobes are symmetrically enlarged. The consistency of the thyroid gland is somewhat full bilaterally. The thyroid gland is not tender to palpation. Lungs: No increased work of breathing Heart: Normal S1 and S2, no abnormal heart sounds or murmurs Abdomen: The abdomen appears to be normal in size for the patient's age. There is no obvious hepatomegaly, splenomegaly, or other mass effect.  Arms: Muscle size and bulk are normal for age. Hands: There is no obvious tremor. Phalangeal and metacarpophalangeal joints are normal. Palmar muscles are normal for age. Palmar skin is normal. Palmar moisture is also normal. Nail polish precludes assessing nail beds for pallor.  Legs: Muscles appear normal for age. No edema is present. Feet: Feet are normally formed. DP pulses are normal 1+.  Neurologic: Strength is normal for age in both the upper and lower extremities. Muscle tone is normal. Sensation to touch is normal in both leg and both feet.    LAB DATA:   Lab Results  Component Value Date   HGBA1C 8.1 (A) 03/04/2021   HGBA1C 8.6 (A) 12/02/2020   HGBA1C 8.8 (A) 08/29/2020     Results for orders placed or performed in visit on 06/19/21 (from the past 504 hour(s))  POCT Glucose (Device for Home Use)   Collection Time: 06/19/21   1:28 PM  Result Value Ref Range   Glucose Fasting, POC     POC Glucose 318 (A) 70 - 99 mg/dl  Labs 06/19/21; CBG 318  Labs 03/11/21: TSH 3.06, free T4 1.0, free T3 3.3; CMP normal; cholesterol 138, triglycerides 35, HDL 69, LDL 59; urinary microalbumin/creatinine ratio 3  Labs 03/04/21: HbA1c 8.1%; CBG 176  Labs 12/02/20: HbA1c 8.6%, CBG 161  Labs 08/29/20: HbA1c 8.8%, CBG 183  Labs 04/18/20: HbA1c 8.6%, CBG 236  Labs 09/13/19: HbA1c 8.3%, CBG 200; TSH 1.39, free T4 0.9; CMP normal; CBC normal, except Hgb 10.4 (ref 11.7-15.5), Hct 34.1 (ref 35-45), MCV 73.8 (ref 80-100), MCH 22.5 (ref 27-33), MCHC 30.5 (ref 32-36), RDW 15.8 (ref 11-15); cholesterol 146, triglycerides 35, HDL 78, LDL 58; urinary microalbumin/creatinine ratio 3   Assessment and Plan:   ASSESSMENT: Alisha Church is a 21 y.o. African-American young woman with type 1 diabetes managed on insulin pump   1. Type 1 diabetes on insulin pump:   A. Her average BG and average SG are higher and her time in control IQ is lower. Overall her BGs and SGs are higher.   B. Glycemic control often tends to worsen in the early morning hours late mornings, and afternoons.  Alisha Church needs higher ICRs.  C. We discussed being careful about carb counting and increased carb coverage. Whenever her BGs are unusually high or low, please take time to analyze why.  2. Hypoglycemia: Alisha Church has had a few low BGs <70, one down to 48.   3. Goiter: Her thyroid gland again enlarged, but the lobes have shifted in size again. Alisha Church was euthyroid in August 2021 and in February 2023, but in February 2023 her TSH was much higher. Alisha Church presumably has evolving Hashimoto's disease. 4. Pallor/Anemia: Alisha Church needs to take her iron regularly.   PLAN:   1. Diagnostic: SG and BG today as above. I reviewed her annual fasting surveillance test results. Continue home monitoring.  2. Therapeutic:  Change current pump settings.    Time Basal Rate Correction factor Carb ratio Target BG   12a  0.9->1.0 40 mg/dL->35 5 388 mg/dL  4a 1.2 -> 1.3 40 mg/dL-> 35 5 828 mg/dL  6a   0.03-> 4.91  40 mg/dL-> 35 5->4 791 mg/dL  8a 1.1 -> 1.2 35 mg/dL ->50 4 569 mg/dL  1p   7.94-> 8.01 35 mg/dl -> 30 4 655 mg/dL  6p 3.7-> 1.2 35 mg/dL-> 30 4 482 mg/dL  9p  7.0->7.8 35 ML/JQ->49            5->4    120 mg/dL  Duration of insulin   3 hours       3. Patient education:  Reviewed Human resources officer. Discussed use of accurate carb counts and food boluses when Alisha Church boluses for high sugars . Discussed allowing the Control IQ to bolus for her.  4. Follow-up: 3 months.   Level of Service: This visit lasted in excess of 55 minutes today spent reviewing the medical chart, getting to know her, counseling the patient, and documenting today's encounter.  When a patient is on insulin, intensive monitoring of blood glucose levels is necessary to avoid hyperglycemia and hypoglycemia. Severe hyperglycemia/hypoglycemia can lead to hospital admissions and be life threatening.    Molli Knock, MD, CDE Adult and Pediatric Endocrinology

## 2021-06-19 ENCOUNTER — Encounter (INDEPENDENT_AMBULATORY_CARE_PROVIDER_SITE_OTHER): Payer: Self-pay | Admitting: "Endocrinology

## 2021-06-19 ENCOUNTER — Ambulatory Visit (INDEPENDENT_AMBULATORY_CARE_PROVIDER_SITE_OTHER): Payer: BC Managed Care – PPO | Admitting: "Endocrinology

## 2021-06-19 VITALS — BP 128/70 | HR 112 | Ht 61.54 in | Wt 129.6 lb

## 2021-06-19 DIAGNOSIS — R231 Pallor: Secondary | ICD-10-CM

## 2021-06-19 DIAGNOSIS — E063 Autoimmune thyroiditis: Secondary | ICD-10-CM

## 2021-06-19 DIAGNOSIS — E049 Nontoxic goiter, unspecified: Secondary | ICD-10-CM | POA: Diagnosis not present

## 2021-06-19 DIAGNOSIS — E1065 Type 1 diabetes mellitus with hyperglycemia: Secondary | ICD-10-CM

## 2021-06-19 DIAGNOSIS — E10649 Type 1 diabetes mellitus with hypoglycemia without coma: Secondary | ICD-10-CM | POA: Diagnosis not present

## 2021-06-19 LAB — POCT GLUCOSE (DEVICE FOR HOME USE): POC Glucose: 318 mg/dl — AB (ref 70–99)

## 2021-06-19 MED ORDER — ONETOUCH VERIO VI STRP: ORAL_STRIP | 5 refills | Status: DC

## 2021-06-19 MED ORDER — ONETOUCH DELICA LANCETS 33G MISC: 5 refills | Status: DC

## 2021-06-19 NOTE — Patient Instructions (Addendum)
Follow up visit in 3 months. Please repeat non-fasting lab tests 1-2 weeks prior.   At [facility], we are committed to providing exceptional care. You will receive a patient satisfaction survey through text or email regarding your visit today. Your opinion is important to me. Comments are appreciated.

## 2021-08-28 ENCOUNTER — Encounter (INDEPENDENT_AMBULATORY_CARE_PROVIDER_SITE_OTHER): Payer: Self-pay

## 2021-09-18 LAB — CBC WITH DIFFERENTIAL/PLATELET
Absolute Monocytes: 408 cells/uL (ref 200–950)
Basophils Absolute: 41 cells/uL (ref 0–200)
Basophils Relative: 0.8 %
Eosinophils Absolute: 102 cells/uL (ref 15–500)
Eosinophils Relative: 2 %
HCT: 33.8 % — ABNORMAL LOW (ref 35.0–45.0)
Hemoglobin: 10.2 g/dL — ABNORMAL LOW (ref 11.7–15.5)
Lymphs Abs: 1545 cells/uL (ref 850–3900)
MCH: 21.7 pg — ABNORMAL LOW (ref 27.0–33.0)
MCHC: 30.2 g/dL — ABNORMAL LOW (ref 32.0–36.0)
MCV: 71.8 fL — ABNORMAL LOW (ref 80.0–100.0)
MPV: 11.5 fL (ref 7.5–12.5)
Monocytes Relative: 8 %
Neutro Abs: 3004 cells/uL (ref 1500–7800)
Neutrophils Relative %: 58.9 %
Platelets: 236 10*3/uL (ref 140–400)
RBC: 4.71 10*6/uL (ref 3.80–5.10)
RDW: 16 % — ABNORMAL HIGH (ref 11.0–15.0)
Total Lymphocyte: 30.3 %
WBC: 5.1 10*3/uL (ref 3.8–10.8)

## 2021-09-18 LAB — HEMOGLOBIN A1C
Hgb A1c MFr Bld: 8.7 % of total Hgb — ABNORMAL HIGH (ref <5.7)
Mean Plasma Glucose: 203 mg/dL
eAG (mmol/L): 11.2 mmol/L

## 2021-09-18 LAB — T3, FREE: T3, Free: 3 pg/mL (ref 2.3–4.2)

## 2021-09-18 LAB — IRON: Iron: 34 ug/dL — ABNORMAL LOW (ref 40–190)

## 2021-09-18 LAB — TSH: TSH: 2.39 mIU/L

## 2021-09-18 LAB — T4, FREE: Free T4: 0.9 ng/dL (ref 0.8–1.8)

## 2021-09-19 ENCOUNTER — Encounter (INDEPENDENT_AMBULATORY_CARE_PROVIDER_SITE_OTHER): Payer: Self-pay | Admitting: "Endocrinology

## 2021-09-19 ENCOUNTER — Ambulatory Visit (INDEPENDENT_AMBULATORY_CARE_PROVIDER_SITE_OTHER): Payer: BC Managed Care – PPO | Admitting: "Endocrinology

## 2021-09-19 VITALS — BP 110/66 | HR 82 | Wt 133.6 lb

## 2021-09-19 DIAGNOSIS — D500 Iron deficiency anemia secondary to blood loss (chronic): Secondary | ICD-10-CM | POA: Diagnosis not present

## 2021-09-19 DIAGNOSIS — E063 Autoimmune thyroiditis: Secondary | ICD-10-CM

## 2021-09-19 DIAGNOSIS — E10649 Type 1 diabetes mellitus with hypoglycemia without coma: Secondary | ICD-10-CM | POA: Diagnosis not present

## 2021-09-19 DIAGNOSIS — E049 Nontoxic goiter, unspecified: Secondary | ICD-10-CM

## 2021-09-19 LAB — POCT GLUCOSE (DEVICE FOR HOME USE): POC Glucose: 83 mg/dl (ref 70–99)

## 2021-09-19 MED ORDER — FERROUS SULFATE 324 (65 FE) MG PO TBEC: DELAYED_RELEASE_TABLET | ORAL | 5 refills | Status: DC

## 2021-09-19 NOTE — Patient Instructions (Signed)
No further follow up here. 

## 2021-09-19 NOTE — Progress Notes (Unsigned)
Subjective:  Patient Name: Alisha Church Date of Birth: [birth year]  MRN: [id number]  Alisha Church  presents to the office today for follow-up evaluation and management of her type 1 diabetes, hypoglycemia, goiter, and pallor.   HISTORY OF PRESENT ILLNESS:   [name] is a 21 y.o. African-American young lady.   [name] was unaccompanied.   1. [name]'s initial pediatric endocrine consultation occurred on 07/24/2010 on an inpatient basis.    [name] was admitted to [facility]'s pediatric ward on 07/19/2010 for new-onset type 1 diabetes mellitus, dehydration, and ketonuria. She had had problems with polyuria, polydipsia, and thirst for several weeks prior to admission. She was initially quite dehydrated. Her blood glucose was 450. Her venous pH was 7.39. Her serum bicarbonate was 21. Her urine glucose was greater than 1000. Urine ketones were greater than 40. Hemoglobin A1c was 13.2%. Her insulin C-peptide was 0.52 (reference 0.80 to 3.9). Her GAD antibody was markedly elevated at >30. Her islet cell antibody was markedly elevated an >80. Her insulin autoantibodies were <0.1.   B. She was started on Lantus insulin as a basal insulin. She was also started on Novolog aspart insulin as her bolus insulin at mealtimes, at bedtime and at 2 AM as needed.   2. Clinical course;  A. She was transitioned to a Medtronic 530G insulin pump 07/25/2012.   B. She  transitioned to Tandem T-Slim/Dexcom with Control IQ on 08/12/2017.    3. The patient's last [facility] visit was on 06/19/21. I continued most of her pump settings.  I again ordered annual surveillance lab tests.   A. In the interim, she has been healthy. Her allergies have not been acting up..   B. She is on a Tandem T-Slim pump and Dexcom G6. They are working well. She needed a pump replacement last week. She had problems with her Dexcom for a while, but recently has resumed using it.   C. She is taking 1000 IU of vitamin D per day. She  recently started on a daily QTC iron supplement.    D. She had to have her pilonidal cysts lanced twice in the past 3 months. She has been referred to a surgeon at [facility].    4. Pertinent Review of Systems:  Constitutional: Tiaria feels "good, but tired". "My energy level is good, but I'm tired." She has been healthy and active.  Eyes: Vision seems to be good with her glasses/contacts. There are no recognized eye problems. Her last eye exam was in June 2023.There were no signs of DM damage.   Neck: She has not had any frank thyroid inflammation.   Heart: Heart rate increases with exercise or other physical activity. She has no complaints of palpitations, irregular heart beats, chest pain, or chest pressure.   Lungs: No asthma or wheezing.   Gastrointestinal: Bowel movents seem normal. The patient has no complaints of excessive hunger, acid reflux, upset stomach, stomach aches or pains, bloating after meals, diarrhea, or constipation.  Hands: No problems Legs: Muscle mass and strength seem normal. There are no complaints of numbness, tingling, burning, or pain. No edema is noted.  Feet: There are no obvious foot problems. There are no complaints of numbness, tingling, burning, or pain. No edema is noted. Neurologic: There are no recognized problems with muscle movement and strength, sensation, or coordination. GYN: LMP was on 08/22/21. Menses occur fairly regularly, with some variation in cycle length. She is not on birth control.  Hypoglycemia: Not often Covid: She  is fully vaccinated now.  Diabetes ID: She has a tattoo on her inner right arm above the wrist. Annual Labs:  09/17/21 Annual eye exam: 07/01/21.  5. T-Slim pump download: We have data for the past two weeks.   A. Average BG is 201, compared with 248 at her last visit.. BG range is 67-382, compared with 143-400 at her last visit.    B. Time in range is 60%. Time above range is 38&. Time below range is 2%. She has been in control  IQ 67% of the time.  C. She boluses 0-5 times per day. She occasionally forgets to bolus, boluses late, or does not bolus enough. She frequently forgets to bolus.   6. CGM download: We have data for the past two weeks. A. Average SG is 168, compared with 229 at her last visit, and with 184 at her prior visit. SG range is 54-323, compared with 48-400 at her last visit and with 40->400 at her prior visit.  B. She has become very reliant on her automatic correction boluses. So unfortunately, when her CGM is not working, she usually does not check BGs and often does not give herself correction and food boluses       PAST MEDICAL, FAMILY, AND SOCIAL HISTORY  Past Medical History:  Diagnosis Date   Dehydration    Diabetes mellitus    Diabetes mellitus without complication (HCC)    Phreesia 01/10/2020   Ketonuria    Type 1 diabetes mellitus not at goal [facility])     Family History  Problem Relation Age of Onset   Thyroid disease Mother    Diabetes Maternal Aunt    Heart disease Maternal Aunt    Hypothyroidism Maternal Grandmother    Thyroid disease Maternal Grandmother    Heart disease Maternal Grandmother    Diabetes Cousin      Current Outpatient Medications:    NOVOLOG 100 UNIT/ML injection, INJECT 300 UNITS INTO PUMP EVERY 48 HOURS AS DIRECTED, Disp: 30 mL, Rfl: 7   benzoyl peroxide (CERAVE ACNE FOAMING CREAM) 4 % external liquid, , Disp: , Rfl:    clindamycin (CLEOCIN T) 1 % external solution, , Disp: , Rfl:    Glucagon (BAQSIMI TWO PACK) 3 MG/DOSE POWD, Place 1 each into the nose as needed (severe hypoglycmia with unresponsiveness). (Patient not taking: Reported on 05/10/2018), Disp: 1 each, Rfl: 3   glucose blood (ONETOUCH VERIO) test strip, Use to check sugars up to 6x daily (Patient not taking: Reported on 09/19/2021), Disp: 100 each, Rfl: 5   Multiple Vitamins-Minerals (WOMENS MULTIVITAMIN PO), Take by mouth. (Patient not taking: Reported on 09/19/2021), Disp: , Rfl:    OneTouch  Delica Lancets 33G MISC, Use to check sugars up to 6x daily (Patient not taking: Reported on 09/19/2021), Disp: 200 each, Rfl: 5   tretinoin (RETIN-A) 0.025 % cream, , Disp: , Rfl:   Allergies as of 09/19/2021   (No Known Allergies)     reports that she has never smoked. She has never used smokeless tobacco. She reports that she does not drink alcohol and does not use drugs. 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    Home and family: She lives at home with her parents.  Activities: She works in [employer]. She attends classes at [employer]. She wants  to become an Charity fundraiser.  Primary Care Provider: Iona Hansen, NP [facility] She drinks alcoholic beverages occasionally.    ROS: There are no other significant problems involving Nariyah's other body systems.   Objective:  Vital Signs:    BP 110/66   Pulse 82   Wt 133 lb 9.6 oz (60.6 kg)   BMI 24.81 kg/m     Ht Readings from Last 3 Encounters:  06/19/21 5' 1.54" (1.563 m)  08/29/20 5' 1.65" (1.566 m)  09/13/19 5' 0.67" (1.541 m)   Wt Readings from Last 3 Encounters:  09/19/21 133 lb 9.6 oz (60.6 kg)  06/19/21 129 lb 9.6 oz (58.8 kg)  03/04/21 131 lb 12.8 oz (59.8 kg)   HC Readings from Last 3 Encounters:  No data found for [facility]   Body surface area is 1.62 meters squared. Facility age limit for growth %iles is 20 years. Facility age limit for growth %iles is 20 years.   PHYSICAL EXAM:   Constitutional: The patient appears healthy and well nourished. The patient's height and weight are normal for age. Her weight has increased 3 pounds since her last visit. Her BMI is in the normal weight zone. She is very bright and alert.  Head: The head is normocephalic. Face: The face appears normal. There are no obvious dysmorphic features. Eyes: The eyes  appear to be normally formed and spaced. Gaze is conjugate. There is no obvious arcus or proptosis. Moisture appears normal. Ears: The ears are normally placed and appear externally normal. Mouth: The oropharynx and tongue appear normal. Dentition appears to be normal for age. Oral moisture is normal. Neck: The neck appears to be visibly enlarged. The thyroid gland is again enlarged at about 22 grams in size. Today the lobes are symmetrically enlarged. The consistency of the thyroid gland is somewhat full bilaterally. The thyroid gland is not tender to palpation. Lungs: No increased work of breathing Heart: Normal S1 and S2, no abnormal heart sounds or murmurs Abdomen: The abdomen appears to be normal in size for the patient's age. There is no obvious hepatomegaly, splenomegaly, or other mass effect.  Arms: Muscle size and bulk are normal for age. Hands: There is no obvious tremor. Phalangeal and metacarpophalangeal joints are normal. Palmar muscles are normal for age. Palmar skin is normal. Palmar moisture is also normal. Nail polish precludes assessing nail beds for pallor.  Legs: Muscles appear normal for age. No edema is present. Feet: Feet are normally formed. DP pulses are normal 1+.  Neurologic: Strength is normal for age in both the upper and lower extremities. Muscle tone is normal. Sensation to touch is normal in both leg and both feet.    LAB DATA:   Lab Results  Component Value Date   HGBA1C 8.7 (H) 09/17/2021   HGBA1C 8.1 (A) 03/04/2021   HGBA1C 8.6 (A) 12/02/2020     Results for orders placed or performed in visit on 09/19/21 (from the past 504 hour(s))  POCT Glucose (Device for Home Use)   Collection Time: 09/19/21  9:39 AM  Result Value Ref Range   Glucose Fasting, POC     POC Glucose 83 70 - 99 mg/dl  Results for orders placed or performed in visit on 06/19/21 (from the past 504 hour(s))  Hemoglobin A1c   Collection Time: 09/17/21  8:16 AM  Result Value Ref Range    Hgb A1c MFr Bld 8.7 (H) <5.7 % of total Hgb   Mean Plasma Glucose 203 mg/dL  eAG (mmol/L) 11.2 mmol/L  T3, free   Collection Time: 09/17/21  8:16 AM  Result Value Ref Range   T3, Free 3.0 2.3 - 4.2 pg/mL  T4, free   Collection Time: 09/17/21  8:16 AM  Result Value Ref Range   Free T4 0.9 0.8 - 1.8 ng/dL  TSH   Collection Time: 09/17/21  8:16 AM  Result Value Ref Range   TSH 2.39 mIU/L  CBC with Differential/Platelet   Collection Time: 09/17/21  8:16 AM  Result Value Ref Range   WBC 5.1 3.8 - 10.8 Thousand/uL   RBC 4.71 3.80 - 5.10 Million/uL   Hemoglobin 10.2 (L) 11.7 - 15.5 g/dL   HCT 16.133.8 (L) 09.635.0 - 04.545.0 %   MCV 71.8 (L) 80.0 - 100.0 fL   MCH 21.7 (L) 27.0 - 33.0 pg   MCHC 30.2 (L) 32.0 - 36.0 g/dL   RDW 40.916.0 (H) 81.111.0 - 91.415.0 %   Platelets 236 140 - 400 Thousand/uL   MPV 11.5 7.5 - 12.5 fL   Neutro Abs 3,004 1,500 - 7,800 cells/uL   Lymphs Abs 1,545 850 - 3,900 cells/uL   Absolute Monocytes 408 200 - 950 cells/uL   Eosinophils Absolute 102 15 - 500 cells/uL   Basophils Absolute 41 0 - 200 cells/uL   Neutrophils Relative % 58.9 %   Total Lymphocyte 30.3 %   Monocytes Relative 8.0 %   Eosinophils Relative 2.0 %   Basophils Relative 0.8 %  Iron   Collection Time: 09/17/21  8:16 AM  Result Value Ref Range   Iron 34 (L) 40 - 190 mcg/dL    Labs 7/82/958/23/23: AOZ3YHbA1c 8.7%; TSH 2.39, free T4 0.9, free T3 3.0; CBC abnormal with Hgb 10.2 (ref 11.7-15.5), Hct 33.8 (ref 35-45), MCV 71.8 (ref 80-100), MCH 21.7 (ref 27-33), MCHC 30.2 (ref 32-36), RDW 16 (ref 11-15) ; iron 34  Labs 06/19/21; CBG 318  Labs 03/11/21: TSH 3.06, free T4 1.0, free T3 3.3; CMP normal; cholesterol 138, triglycerides 35, HDL 69, LDL 59; urinary microalbumin/creatinine ratio 3  Labs 03/04/21: HbA1c 8.1%; CBG 176  Labs 12/02/20: HbA1c 8.6%, CBG 161  Labs 08/29/20: HbA1c 8.8%, CBG 183  Labs 04/18/20: HbA1c 8.6%, CBG 236  Labs 09/13/19: HbA1c 8.3%, CBG 200; TSH 1.39, free T4 0.9; CMP normal; CBC normal, except  Hgb 10.4 (ref 11.7-15.5), Hct 34.1 (ref 35-45), MCV 73.8 (ref 80-100), MCH 22.5 (ref 27-33), MCHC 30.5 (ref 32-36), RDW 15.8 (ref 11-15); cholesterol 146, triglycerides 35, HDL 78, LDL 58; urinary microalbumin/creatinine ratio 3   Assessment and Plan:   ASSESSMENT: Audree Camelmieya is a 21 y.o. African-American young woman with type 1 diabetes managed on insulin pump   1. Type 1 diabetes on insulin pump:   A. Her HbA1c is higher, but her average BG and average SG are lower. She had been off the CGM for quite some time 1-3 month ago, but has been back on the CGM for much of the past two weeks.  B. When she is in Control IQ, her SGS are much better. She understands the need to perform BG checks and give more insulin boluses if she is not in control IQ mode.  C. We discussed being careful about carb counting and increased carb coverage. Whenever her BGs are unusually high or low, please take time to analyze why.  2. Hypoglycemia: She has had a few low BGs <70, one down to 54.   3. Goiter: Her thyroid gland is enlarged again and is firmer, c/w  evolving Hashimoto's disease. She remains euthyroid.  4. Pallor/Anemia: Her iron deficiency anemia is worse. She needs to take her iron regularly.   PLAN:   1. Diagnostic: SG and BG today as above. I reviewed her annual fasting surveillance test results. Continue home monitoring.  2. Therapeutic:  Start ferrous sulfate 325 mg/day. Continue.current pump settings.    Time Basal Rate Correction factor Carb ratio Target BG   12a 1.0 35 5 150 mg/dL  4a 1.3 35 5 102 mg/dL  6a   5.85  35 4 277 mg/dL  8a 1.12  30 4  110 mg/dL  1p  1.25  30 4  110 mg/dL  6p 1.2  30 4  120 mg/dL  9p  1.2   30            4     120 mg/dL  Duration of insulin   3 hours       3. Patient education:  Reviewed Human resources officer. Discussed use of accurate carb counts and food boluses when she boluses for high sugars . Discussed allowing the Control IQ to bolus for her.  4. Follow-up: 3 months. I  tried to refer her to Dr. Ocie Cornfield at [facility].    Level of Service: This visit lasted in excess of 55 minutes today spent reviewing the medical chart, getting to know her, counseling the patient, and documenting today's encounter.  When a patient is on insulin, intensive monitoring of blood glucose levels is necessary to avoid hyperglycemia and hypoglycemia. Severe hyperglycemia/hypoglycemia can lead to hospital admissions and be life threatening.    Molli Knock, MD, CDE Adult and Pediatric Endocrinology

## 2021-09-23 ENCOUNTER — Telehealth (INDEPENDENT_AMBULATORY_CARE_PROVIDER_SITE_OTHER): Payer: Self-pay

## 2021-09-23 NOTE — Telephone Encounter (Signed)
Patient called back. She said that she would like to do some research on endo providers with [facility] and will call us back.

## 2021-09-23 NOTE — Telephone Encounter (Signed)
Called patient. Dr Fransico Michael wants to refer her to Dr. Ocie Cornfield with [facility]. She will need to contact her primary to put in the referral.

## 2021-10-09 ENCOUNTER — Telehealth (INDEPENDENT_AMBULATORY_CARE_PROVIDER_SITE_OTHER): Payer: Self-pay | Admitting: "Endocrinology

## 2021-10-09 DIAGNOSIS — E10649 Type 1 diabetes mellitus with hypoglycemia without coma: Secondary | ICD-10-CM

## 2021-10-09 NOTE — Telephone Encounter (Signed)
  Name of who is calling: Aeris  Caller's Relationship to Patient: Self    Best contact number: [phone]  Provider they see: Dr.Brennan  Reason for call: Vennela called to request her referral to be sent to [facility]. NP  Ronny Bacon.     PRESCRIPTION REFILL ONLY  Name of prescription:  Pharmacy:

## 2021-12-04 LAB — CBC AND DIFFERENTIAL
HCT: 31 — AB (ref 36–46)
Hemoglobin: 9.5 — AB (ref 12.0–16.0)

## 2021-12-04 LAB — LIPID PANEL
LDL Cholesterol: 57
Triglycerides: 55 (ref 40–160)

## 2021-12-04 LAB — COMPREHENSIVE METABOLIC PANEL
Calcium: 9 (ref 8.7–10.7)
eGFR: 100

## 2021-12-04 LAB — BASIC METABOLIC PANEL
BUN: 11 (ref 4–21)
Creatinine: 0.8 (ref 0.5–1.1)

## 2021-12-17 ENCOUNTER — Ambulatory Visit (INDEPENDENT_AMBULATORY_CARE_PROVIDER_SITE_OTHER): Payer: BC Managed Care – PPO | Admitting: Nurse Practitioner

## 2021-12-17 ENCOUNTER — Encounter: Payer: Self-pay | Admitting: Nurse Practitioner

## 2021-12-17 VITALS — BP 111/70 | HR 94 | Ht 61.54 in | Wt 136.2 lb

## 2021-12-17 DIAGNOSIS — E109 Type 1 diabetes mellitus without complications: Secondary | ICD-10-CM | POA: Diagnosis not present

## 2021-12-17 MED ORDER — DEXCOM G7 SENSOR MISC: 1.0000 | 3 refills | Status: DC

## 2021-12-17 NOTE — Patient Instructions (Signed)

## 2021-12-17 NOTE — Progress Notes (Signed)
Endocrinology Consult Note       12/17/2021, 5:13 PM   Subjective:    Patient ID: Alisha Church, female    DOB: 18-Feb-2000.  Alisha Church is being seen in consultation for management of currently uncontrolled symptomatic diabetes requested by  Berkley Harvey, NP.   Past Medical History:  Diagnosis Date   Dehydration    Diabetes mellitus    Diabetes mellitus without complication ([place])    Phreesia 01/10/2020   Ketonuria    Type 1 diabetes mellitus not at goal [facility])     Past Surgical History:  Procedure Laterality Date   PILONIDAL CYST / SINUS EXCISION      Social History   Socioeconomic History   Marital status: Single    Spouse name: Not on file   Number of children: Not on file   Years of education: Not on file   Highest education level: Not on file  Occupational History   Not on file  Tobacco Use   Smoking status: Never   Smokeless tobacco: Never  Substance and Sexual Activity   Alcohol use: No   Drug use: No   Sexual activity: Never  Other Topics Concern   Not on file  Social History Narrative   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    [employer]    Social Determinants of Health   Financial Resource Strain: Not on file  Food Insecurity: Not on file  Transportation Needs: Not on file  Physical Activity: Not on file  Stress: Not on file  Social Connections: Not on file    Family History  Problem Relation Age of Onset   Thyroid disease Mother    Diabetes Maternal Aunt    Heart disease Maternal Aunt    Hypothyroidism Maternal Grandmother    Thyroid disease Maternal Grandmother    Heart disease Maternal Grandmother    Diabetes Cousin     Outpatient Encounter Medications as of 12/17/2021  Medication Sig   Continuous Blood Gluc Sensor (DEXCOM G7 SENSOR) MISC Inject 1 Application into the skin as directed. Change sensor every 10 days as directed.    ferrous sulfate 324 (65 Fe) MG TBEC Take one tablet or capsule daily.   Multiple Vitamin (MULTIVITAMIN WITH MINERALS) TABS tablet Take 1 tablet by mouth daily.   NOVOLOG 100 UNIT/ML injection INJECT 300 UNITS INTO PUMP EVERY 48 HOURS AS DIRECTED   [DISCONTINUED] benzoyl peroxide (CERAVE ACNE FOAMING CREAM) 4 % external liquid  (Patient not taking: Reported on 09/19/2021)   [DISCONTINUED] clindamycin (CLEOCIN T) 1 % external solution  (Patient not taking: Reported on 09/19/2021)   [DISCONTINUED] Glucagon (BAQSIMI TWO PACK) 3 MG/DOSE POWD Place 1 each into the nose as needed (severe hypoglycmia with unresponsiveness). (Patient not taking: Reported on 05/10/2018)   [DISCONTINUED] glucose blood (ONETOUCH VERIO) test strip Use to check sugars up to 6x daily (Patient not taking: Reported on 09/19/2021)   [DISCONTINUED] Multiple Vitamins-Minerals (WOMENS MULTIVITAMIN PO) Take by mouth. (Patient not taking: Reported on 09/19/2021)   [DISCONTINUED] OneTouch Delica Lancets 81K MISC Use to check sugars up to 6x daily (Patient not taking: Reported on 09/19/2021)   [DISCONTINUED] tretinoin (RETIN-A) 0.025 %  cream  (Patient not taking: Reported on 09/19/2021)   No facility-administered encounter medications on file as of 12/17/2021.    ALLERGIES: No Known Allergies  VACCINATION STATUS: Immunization History  Administered Date(s) Administered   DTaP 03/03/1999, 05/14/1999, 07/15/1999, 05/17/2000, 04/11/2004   H1N1 01/17/2008   HIB (PRP-OMP) 03/03/1999, 05/14/1999, 07/15/1999, 05/17/2000   HPV 9-valent 07/24/2010, 09/17/2011, 12/01/2012   HPV Quadrivalent 07/24/2010, 09/17/2011, 12/01/2012   Hepatitis A 11/12/2006, 06/13/2007   Hepatitis A, Adult 11/12/2006, 06/13/2007   Hepatitis B 2000-03-31, 03/12/1999, 07/15/1999   Hepatitis B, PED/ADOLESCENT [birth year], 03/12/1999, 07/15/1999   Hpv-Unspecified 07/24/2010, 09/17/2011   IPV 03/12/1999, 05/14/1999, 02/24/2000, 11/23/2000, 04/11/2004   Influenza Nasal  01/17/2008   Influenza Whole 12/09/2011   Influenza,inj,Quad PF,6+ Mos 11/02/2012, 12/24/2014, 02/08/2018   Influenza,inj,quad, With Preservative 02/26/2014   Influenza-Unspecified 01/17/2008, 11/02/2012, 02/26/2014, 12/24/2014   MMR 02/24/2000, 04/11/2004   Meningococcal Conjugate 07/24/2010, 05/13/2016, 05/13/2016   PFIZER Comirnaty(Gray Top)Covid-19 Tri-Sucrose Vaccine 10/09/2020, 11/10/2020   Pneumococcal Conjugate-13 03/03/1999, 05/14/1999, 07/15/1999, 02/24/2000   Pneumococcal Polysaccharide-23 03/03/1999, 05/14/1999   Pneumococcal-Unspecified 03/03/1999, 05/14/1999, 07/15/1999, 02/24/2000   Tdap 07/24/2010   Varicella 05/17/2000, 11/12/2006    Diabetes She presents for her initial diabetic visit. She has type 1 diabetes mellitus. Onset time: Diagnosed at approx age of 44. Her disease course has been fluctuating. There are no hypoglycemic associated symptoms. There are no diabetic associated symptoms. There are no hypoglycemic complications. There are no diabetic complications. Risk factors for coronary artery disease include diabetes mellitus. Current diabetic treatment includes insulin pump. She is compliant with treatment most of the time. Her weight is stable. She is following a generally healthy diet. Meal planning includes avoidance of concentrated sweets. She has had a previous visit with a dietitian. She participates in exercise intermittently. Her home blood glucose trend is fluctuating dramatically. (She presents today for her consultation with her Tandem pump, CGM, and meter showing fluctuating glycemic profile.  Her most recent A1c on 8/23 was 8.7%.  She was being seen by Dr. Tobe Sos but since he retired, she needed to find a new endocrinologist.  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   She is using CGM but it is not able to link to her Tandem pump for the smart features.  She said she is charged over 600 dollars for the dexcom G6.) An ACE  inhibitor/angiotensin II receptor blocker is not being taken. She does not see a podiatrist.Eye exam is current.     Review of systems  Constitutional: + Minimally fluctuating body weight, current Body mass index is 25.29 kg/m., no fatigue, no subjective hyperthermia, no subjective hypothermia Eyes: no blurry vision, no xerophthalmia ENT: no sore throat, no nodules palpated in throat, no dysphagia/odynophagia, no hoarseness Cardiovascular: no chest pain, no shortness of breath, no palpitations, no leg swelling Respiratory: no cough, no shortness of breath Gastrointestinal: no nausea/vomiting/diarrhea Musculoskeletal: no muscle/joint aches Skin: no rashes, no hyperemia Neurological: no tremors, no numbness, no tingling, no dizziness Psychiatric: no depression, no anxiety  Objective:     BP 111/70 (BP Location: Right Arm, Patient Position: Sitting, Cuff Size: Normal)   Pulse 94   Ht 5' 1.54" (1.563 m)   Wt 136 lb 3.2 oz (61.8 kg)   BMI 25.29 kg/m   Wt Readings from Last 3 Encounters:  12/17/21 136 lb 3.2 oz (61.8 kg)  09/19/21 133 lb 9.6 oz (60.6 kg)  06/19/21 129 lb 9.6 oz (58.8 kg)     BP Readings  from Last 3 Encounters:  12/17/21 111/70  09/19/21 110/66  06/19/21 128/70     Physical Exam- Limited  Constitutional:  Body mass index is 25.29 kg/m. , not in acute distress, normal state of mind Eyes:  EOMI, no exophthalmos Neck: Supple Thyroid: + mild goiter, denies any compressive symptoms Cardiovascular: RRR, + murmur, rubs, or gallops, no edema Respiratory: Adequate breathing efforts, no crackles, rales, rhonchi, or wheezing Musculoskeletal: no gross deformities, strength intact in all four extremities, no gross restriction of joint movements Skin:  no rashes, no hyperemia Neurological: no tremor with outstretched hands    CMP ( most recent) CMP     Component Value Date/Time   NA 140 03/11/2021 0813   K 4.0 03/11/2021 0813   CL 106 03/11/2021 0813   CO2 24  03/11/2021 0813   GLUCOSE 92 03/11/2021 0813   BUN 9 03/11/2021 0813   CREATININE 0.68 03/11/2021 0813   CALCIUM 9.5 03/11/2021 0813   PROT 7.3 03/11/2021 0813   ALBUMIN 4.4 04/04/2015 1433   AST 12 03/11/2021 0813   ALT 9 03/11/2021 0813   ALKPHOS 134 04/04/2015 1433   BILITOT 0.3 03/11/2021 0813   GFRNONAA NOT CALCULATED 07/19/2010 1509   GFRAA NOT CALCULATED 07/19/2010 1509     Diabetic Labs (most recent): Lab Results  Component Value Date   HGBA1C 8.7 (H) 09/17/2021   HGBA1C 8.1 (A) 03/04/2021   HGBA1C 8.6 (A) 12/02/2020   MICROALBUR 0.4 03/11/2021   MICROALBUR 0.4 09/13/2019   MICROALBUR 0.7 10/25/2018     Lipid Panel ( most recent) Lipid Panel     Component Value Date/Time   CHOL 138 03/11/2021 0813   TRIG 35 03/11/2021 0813   HDL 69 03/11/2021 0813   CHOLHDL 2.0 03/11/2021 0813   VLDL 12 04/04/2015 1433   LDLCALC 59 03/11/2021 0813      Lab Results  Component Value Date   TSH 2.39 09/17/2021   TSH 3.06 03/11/2021   TSH 1.39 09/13/2019   TSH 1.28 10/25/2018   TSH 1.76 06/17/2017   TSH 1.10 04/04/2015   TSH 2.191 01/18/2014   TSH 2.850 10/31/2012   TSH 2.261 12/18/2011   TSH 2.253 10/08/2010   FREET4 0.9 09/17/2021   FREET4 1.0 03/11/2021   FREET4 0.9 09/13/2019   FREET4 1.0 10/25/2018   FREET4 1.2 06/17/2017   FREET4 1.2 04/04/2015   FREET4 1.09 01/18/2014   FREET4 1.07 10/31/2012   FREET4 1.11 12/18/2011   FREET4 1.19 10/08/2010           Assessment & Plan:   1) Type 1 diabetes mellitus without complication ([place])  She presents today for her consultation with her Tandem pump, CGM, and meter showing fluctuating glycemic profile.  Her most recent A1c on 8/23 was 8.7%.  She was being seen by Dr. Tobe Sos but since he retired, she needed to find a new endocrinologist.  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   She is using CGM but it is not able to link to her Tandem pump for the smart features.  She  said she is charged over 600 dollars for the dexcom G6.  - Licet Dunphy has currently uncontrolled symptomatic type 1 DM since 21 years of age, with most recent A1c of 8.7 %.   -Recent labs reviewed.  - I had a long discussion with her about the progressive nature of diabetes and the pathology behind its complications. -her diabetes is not currently  complicated but she remains at a high risk for more acute and chronic complications which include CAD, CVA, CKD, retinopathy, and neuropathy. These are all discussed in detail with her.  The following Lifestyle Medicine recommendations according to [facility]) were discussed and offered to patient and she agrees to start the journey:  A. Whole Foods, Plant-based plate comprising of fruits and vegetables, plant-based proteins, whole-grain carbohydrates was discussed in detail with the patient.   A list for source of those nutrients were also provided to the patient.  Patient will use only water or unsweetened tea for hydration. B.  The need to stay away from risky substances including alcohol, smoking; obtaining 7 to 9 hours of restorative sleep, at least 150 minutes of moderate intensity exercise weekly, the importance of healthy social connections,  and stress reduction techniques were discussed. C.  A full color page of  Calorie density of various food groups per pound showing examples of each food groups was provided to the patient.  - I have counseled her on diet and weight management by adopting a carbohydrate restricted/protein rich diet. Patient is encouraged to switch to unprocessed or minimally processed complex starch and increased protein intake (animal or plant source), fruits, and vegetables. -  she is advised to stick to a routine mealtimes to eat 3 meals a day and avoid unnecessary snacks (to snack only to correct hypoglycemia).   - she acknowledges that there is a room for improvement in her food and  drink choices. - Suggestion is made for her to avoid simple carbohydrates from her diet including Cakes, Sweet Desserts, Ice Cream, Soda (diet and regular), Sweet Tea, Candies, Chips, Cookies, Store Bought Juices, Alcohol in Excess of 1-2 drinks a day, Artificial Sweeteners, Coffee Creamer, and "Sugar-free" Products. This will help patient to have more stable blood glucose profile and potentially avoid unintended weight gain.  - I have approached her with the following individualized plan to manage her diabetes and patient agrees:   -No changes will be made to her pump settings today.  Priority would be to get her CGM and pump connected to take full advantage of smart functions which adjusts settings based on glucose readings.  -she is encouraged to continue monitoring glucose 4 times daily (using her CGM), before meals and before bed.  I did give her sample Dexcom G7 today (that is compatible with Tandem T-slim pumps).  She is advised to try doing system update so that her pump will be able to be synced.  We also discussed potentially changing to Omnipod 5 system.  She says she has had several issues recently with her Tandem pump and finds the ordering of her pump supplies troublesome as well.  - she is warned not to take insulin without proper monitoring per orders. - Adjustment parameters are given to her for hypo and hyperglycemia in writing. - she is encouraged to call clinic for blood glucose levels less than 70 or above 300 mg /dl.  -Due to her type 1 diagnosis, she is not a candidate for non-insulin therapies.  - Specific targets for  A1c; LDL, HDL, and Triglycerides were discussed with the patient.  2) Blood Pressure /Hypertension:  her blood pressure is controlled to target without the use of antihypertensive medications.     3) Lipids/Hyperlipidemia:    Review of her recent lipid panel from 03/11/21 showed controlled LDL at 59.  She is not currently on any lipid lowering medications.    4)  Weight/Diet:  her Body mass index is 25.29 kg/m.  -   she is NOT a candidate for weight loss.  Exercise, and detailed carbohydrates information provided  -  detailed on discharge instructions.  5) Chronic Care/Health Maintenance: -she is not on ACEI/ARB or Statin medications and is encouraged to initiate and continue to follow up with Ophthalmology, Dentist, Podiatrist at least yearly or according to recommendations, and advised to stay away from smoking. I have recommended yearly flu vaccine and pneumonia vaccine at least every 5 years; moderate intensity exercise for up to 150 minutes weekly; and sleep for at least 7 hours a day.  - she is advised to maintain close follow up with Berkley Harvey, NP for primary care needs, as well as her other providers for optimal and coordinated care.   - Time spent in this patient care: 60 min, of which > 50% was spent in counseling her about her diabetes and the rest reviewing her blood glucose logs, discussing her hypoglycemia and hyperglycemia episodes, reviewing her current and previous labs/studies (including abstraction from other facilities) and medications doses and developing a long term treatment plan based on the latest standards of care/guidelines; and documenting her care.    Please refer to Patient Instructions for Blood Glucose Monitoring and Insulin/Medications Dosing Guide" in media tab for additional information. Please also refer to "Patient Self Inventory" in the Media tab for reviewed elements of pertinent patient history.  Alisha Church participated in the discussions, expressed understanding, and voiced agreement with the above plans.  All questions were answered to her satisfaction. she is encouraged to contact clinic should she have any questions or concerns prior to her return visit.     Follow up plan: - Return in about 1 month (around 01/16/2022) for Diabetes F/U, Bring meter and logs.    Rayetta Pigg,  [facility] [address] [place], [place] [address] Phone: [phone] Fax: [phone]  12/17/2021, 5:13 PM

## 2022-01-05 ENCOUNTER — Telehealth: Payer: Self-pay | Admitting: Oncology

## 2022-01-05 NOTE — Telephone Encounter (Signed)
Scheduled appointment per referral. Patient is aware of appointment date and time. Patient is aware to arrive 15 mins prior to appointment time and to bring updated insurance cards. Patient is aware of location.   

## 2022-01-06 ENCOUNTER — Ambulatory Visit: Admission: EM | Admit: 2022-01-06 | Discharge: 2022-01-06 | Discharge status: Against Medical Advice | Payer: Self-pay

## 2022-01-06 NOTE — ED Provider Notes (Signed)
Patient advised that we are unable to perform wound cultures of the drainage from her pilonidal cyst as she has had done twice at [facility] urgent care.  Patient states she prefers to go to one of the urgent care locations instead.  No billable services were provided for this patient today.   Theadora Rama Scales, PA-C 01/06/22 1927

## 2022-01-21 NOTE — Patient Instructions (Signed)

## 2022-01-22 ENCOUNTER — Encounter: Payer: Self-pay | Admitting: Nurse Practitioner

## 2022-01-22 ENCOUNTER — Ambulatory Visit: Payer: BC Managed Care – PPO | Admitting: Nurse Practitioner

## 2022-01-22 VITALS — BP 105/67 | HR 92 | Ht 61.54 in | Wt 133.6 lb

## 2022-01-22 DIAGNOSIS — E109 Type 1 diabetes mellitus without complications: Secondary | ICD-10-CM

## 2022-01-22 LAB — POCT GLYCOSYLATED HEMOGLOBIN (HGB A1C): Hemoglobin A1C: 8.6 % — AB (ref 4.0–5.6)

## 2022-01-22 NOTE — Progress Notes (Signed)
Endocrinology Follow Up Note       01/22/2022, 2:17 PM   Subjective:    Patient ID: Alisha Church, female    DOB: 2000-07-03.  Alisha Church is being seen in follow up after being seen in consultation for management of currently uncontrolled symptomatic diabetes requested by  Berkley Harvey, NP.   Past Medical History:  Diagnosis Date   Dehydration    Diabetes mellitus    Diabetes mellitus without complication ([place])    Phreesia 01/10/2020   Ketonuria    Type 1 diabetes mellitus not at goal [facility])     Past Surgical History:  Procedure Laterality Date   PILONIDAL CYST / SINUS EXCISION      Social History   Socioeconomic History   Marital status: Single    Spouse name: Not on file   Number of children: Not on file   Years of education: Not on file   Highest education level: Not on file  Occupational History   Not on file  Tobacco Use   Smoking status: Never   Smokeless tobacco: Never  Substance and Sexual Activity   Alcohol use: No   Drug use: No   Sexual activity: Never  Other Topics Concern   Not on file  Social History Narrative   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    [employer]    Social Determinants of Health   Financial Resource Strain: Not on file  Food Insecurity: Not on file  Transportation Needs: Not on file  Physical Activity: Not on file  Stress: Not on file  Social Connections: Not on file    Family History  Problem Relation Age of Onset   Thyroid disease Mother    Diabetes Maternal Aunt    Heart disease Maternal Aunt    Hypothyroidism Maternal Grandmother    Thyroid disease Maternal Grandmother    Heart disease Maternal Grandmother    Diabetes Cousin     Outpatient Encounter Medications as of 01/22/2022  Medication Sig   ferrous sulfate 324 (65 Fe) MG TBEC Take one tablet or capsule daily.   Multiple Vitamin (MULTIVITAMIN WITH  MINERALS) TABS tablet Take 1 tablet by mouth daily.   NOVOLOG 100 UNIT/ML injection INJECT 300 UNITS INTO PUMP EVERY 48 HOURS AS DIRECTED   Continuous Blood Gluc Sensor (DEXCOM G7 SENSOR) MISC Inject 1 Application into the skin as directed. Change sensor every 10 days as directed. (Patient not taking: Reported on 01/22/2022)   No facility-administered encounter medications on file as of 01/22/2022.    ALLERGIES: No Known Allergies  VACCINATION STATUS: Immunization History  Administered Date(s) Administered   DTaP 03/03/1999, 05/14/1999, 07/15/1999, 05/17/2000, 04/11/2004   H1N1 01/17/2008   HIB (PRP-OMP) 03/03/1999, 05/14/1999, 07/15/1999, 05/17/2000   HPV 9-valent 07/24/2010, 09/17/2011, 12/01/2012   HPV Quadrivalent 07/24/2010, 09/17/2011, 12/01/2012   Hepatitis A 11/12/2006, 06/13/2007   Hepatitis A, Adult 11/12/2006, 06/13/2007   Hepatitis B 1998/05[birth year], 03/12/1999, 07/15/1999   Hepatitis B, PED/ADOLESCENT 13-Mar-1998, 03/12/1999, 07/15/1999   Hpv-Unspecified 07/24/2010, 09/17/2011   IPV 03/12/1999, 05/14/1999, 02/24/2000, 11/23/2000, 04/11/2004   Influenza Nasal 01/17/2008   Influenza Whole 12/09/2011   Influenza,inj,Quad PF,6+ Mos 11/02/2012, 12/24/2014, 02/08/2018  Influenza,inj,quad, With Preservative 02/26/2014   Influenza-Unspecified 01/17/2008, 11/02/2012, 02/26/2014, 12/24/2014   MMR 02/24/2000, 04/11/2004   Meningococcal Conjugate 07/24/2010, 05/13/2016, 05/13/2016   PFIZER Comirnaty(Gray Top)Covid-19 Tri-Sucrose Vaccine 10/09/2020, 11/10/2020   Pneumococcal Conjugate-13 03/03/1999, 05/14/1999, 07/15/1999, 02/24/2000   Pneumococcal Polysaccharide-23 03/03/1999, 05/14/1999   Pneumococcal-Unspecified 03/03/1999, 05/14/1999, 07/15/1999, 02/24/2000   Tdap 07/24/2010   Varicella 05/17/2000, 11/12/2006    Diabetes She presents for her follow-up diabetic visit. She has type 1 diabetes mellitus. Onset time: Diagnosed at approx age of 53. Her disease course has been  fluctuating. There are no hypoglycemic associated symptoms. There are no diabetic associated symptoms. There are no hypoglycemic complications. There are no diabetic complications. Risk factors for coronary artery disease include diabetes mellitus. Current diabetic treatment includes insulin pump. She is compliant with treatment most of the time. Her weight is stable. She is following a generally healthy diet. Meal planning includes avoidance of concentrated sweets. She has had a previous visit with a dietitian. She participates in exercise intermittently. Her home blood glucose trend is fluctuating dramatically. Her overall blood glucose range is >200 mg/dl. (She presents today with her CGM and pump showing drastically fluctuating glycemic profile.  Her POCT A1c today is 8.6%, essentially unchanged from previous visit orf 8.7%.  Analysis of her CGM shows TIR 30%, TAR 67%, TBR 3% with a GMI of 8.4%.  She has not been able to update her Tandem pump to the latest software to take full advantage of the control IQ feature.  She notes over the last several says she hasn't eaten much, has felt sick on her stomach.  She also notes she will be having iron infusions soon for low iron.  She starts nursing school next week!) An ACE inhibitor/angiotensin II receptor blocker is not being taken. She does not see a podiatrist.Eye exam is current.    Review of systems  Constitutional: + Minimally fluctuating body weight,  current Body mass index is 24.8 kg/m. , no fatigue, no subjective hyperthermia, no subjective hypothermia Eyes: no blurry vision, no xerophthalmia ENT: no sore throat, no nodules palpated in throat, no dysphagia/odynophagia, no hoarseness Cardiovascular: no chest pain, no shortness of breath, no palpitations, no leg swelling Respiratory: no cough, no shortness of breath Gastrointestinal: no nausea/vomiting/diarrhea Musculoskeletal: no muscle/joint aches Skin: no rashes, no hyperemia Neurological: no  tremors, no numbness, no tingling, no dizziness Psychiatric: no depression, no anxiety  Objective:     BP 105/67 (BP Location: Left Arm, Patient Position: Sitting, Cuff Size: Normal)   Pulse 92   Ht 5' 1.54" (1.563 m)   Wt 133 lb 9.6 oz (60.6 kg)   BMI 24.80 kg/m   Wt Readings from Last 3 Encounters:  01/22/22 133 lb 9.6 oz (60.6 kg)  12/17/21 136 lb 3.2 oz (61.8 kg)  09/19/21 133 lb 9.6 oz (60.6 kg)     BP Readings from Last 3 Encounters:  01/22/22 105/67  01/06/22 112/76  12/17/21 111/70     Physical Exam- Limited  Constitutional:  Body mass index is 24.8 kg/m. , not in acute distress, normal state of mind Eyes:  EOMI, no exophthalmos Musculoskeletal: no gross deformities, strength intact in all four extremities, no gross restriction of joint movements Skin:  no rashes, no hyperemia Neurological: no tremor with outstretched hands    CMP ( most recent) CMP     Component Value Date/Time   NA 140 03/11/2021 0813   K 4.0 03/11/2021 0813   CL 106 03/11/2021 0813   CO2 24 03/11/2021  0813   GLUCOSE 92 03/11/2021 0813   BUN 11 12/04/2021 0000   CREATININE 0.8 12/04/2021 0000   CREATININE 0.68 03/11/2021 0813   CALCIUM 9.0 12/04/2021 0000   PROT 7.3 03/11/2021 0813   ALBUMIN 4.4 04/04/2015 1433   AST 12 03/11/2021 0813   ALT 9 03/11/2021 0813   ALKPHOS 134 04/04/2015 1433   BILITOT 0.3 03/11/2021 0813   GFRNONAA NOT CALCULATED 07/19/2010 1509   GFRAA NOT CALCULATED 07/19/2010 1509     Diabetic Labs (most recent): Lab Results  Component Value Date   HGBA1C 8.6 (A) 01/22/2022   HGBA1C 8.7 (H) 09/17/2021   HGBA1C 8.1 (A) 03/04/2021   MICROALBUR 0.4 03/11/2021   MICROALBUR 0.4 09/13/2019   MICROALBUR 0.7 10/25/2018     Lipid Panel ( most recent) Lipid Panel     Component Value Date/Time   CHOL 138 03/11/2021 0813   TRIG 55 12/04/2021 0000   HDL 69 03/11/2021 0813   CHOLHDL 2.0 03/11/2021 0813   VLDL 12 04/04/2015 1433   LDLCALC 57 12/04/2021 0000    LDLCALC 59 03/11/2021 0813      Lab Results  Component Value Date   TSH 2.39 09/17/2021   TSH 3.06 03/11/2021   TSH 1.39 09/13/2019   TSH 1.28 10/25/2018   TSH 1.76 06/17/2017   TSH 1.10 04/04/2015   TSH 2.191 01/18/2014   TSH 2.850 10/31/2012   TSH 2.261 12/18/2011   TSH 2.253 10/08/2010   FREET4 0.9 09/17/2021   FREET4 1.0 03/11/2021   FREET4 0.9 09/13/2019   FREET4 1.0 10/25/2018   FREET4 1.2 06/17/2017   FREET4 1.2 04/04/2015   FREET4 1.09 01/18/2014   FREET4 1.07 10/31/2012   FREET4 1.11 12/18/2011   FREET4 1.19 10/08/2010           Assessment & Plan:   1) Type 1 diabetes mellitus without complication ([place])  She presents today with her CGM and pump showing drastically fluctuating glycemic profile.  Her POCT A1c today is 8.6%, essentially unchanged from previous visit orf 8.7%.  Analysis of her CGM shows TIR 30%, TAR 67%, TBR 3% with a GMI of 8.4%.  She has not been able to update her Tandem pump to the latest software to take full advantage of the control IQ feature.  She notes over the last several says she hasn't eaten much, has felt sick on her stomach.  She also notes she will be having iron infusions soon for low iron.  She starts nursing school next week!  - Alisha Church has currently uncontrolled symptomatic type 1 DM since 21 years of age.   -Recent labs reviewed.  - I had a long discussion with her about the progressive nature of diabetes and the pathology behind its complications. -her diabetes is not currently complicated but she remains at a high risk for more acute and chronic complications which include CAD, CVA, CKD, retinopathy, and neuropathy. These are all discussed in detail with her.  The following Lifestyle Medicine recommendations according to [facility]) were discussed and offered to patient and she agrees to start the journey:  A. Whole Foods, Plant-based plate comprising of fruits and vegetables,  plant-based proteins, whole-grain carbohydrates was discussed in detail with the patient.   A list for source of those nutrients were also provided to the patient.  Patient will use only water or unsweetened tea for hydration. B.  The need to stay away from risky substances including alcohol, smoking; obtaining 7 to  9 hours of restorative sleep, at least 150 minutes of moderate intensity exercise weekly, the importance of healthy social connections,  and stress reduction techniques were discussed. C.  A full color page of  Calorie density of various food groups per pound showing examples of each food groups was provided to the patient.  - Nutritional counseling repeated at each appointment due to patients tendency to fall back in to old habits.  - The patient admits there is a room for improvement in their diet and drink choices. -  Suggestion is made for the patient to avoid simple carbohydrates from their diet including Cakes, Sweet Desserts / Pastries, Ice Cream, Soda (diet and regular), Sweet Tea, Candies, Chips, Cookies, Sweet Pastries, Store Bought Juices, Alcohol in Excess of 1-2 drinks a day, Artificial Sweeteners, Coffee Creamer, and "Sugar-free" Products. This will help patient to have stable blood glucose profile and potentially avoid unintended weight gain.   - I encouraged the patient to switch to unprocessed or minimally processed complex starch and increased protein intake (animal or plant source), fruits, and vegetables.   - Patient is advised to stick to a routine mealtimes to eat 3 meals a day and avoid unnecessary snacks (to snack only to correct hypoglycemia).  - I have approached her with the following individualized plan to manage her diabetes and patient agrees:   -No changes will be made to her pump settings today.  Priority would be to get her CGM and pump connected to take full advantage of smart functions which adjusts settings based on glucose readings.  I did receive  request for documentation for her Tandem pump/supplies which should help her get the software update needed for the G7 to be compatible.  She is still considering changing to Omnipod in the future.  -she is encouraged to continue monitoring glucose 4 times daily (using her CGM), before meals and before bed.  I did give her sample Libre 3 today.  - she is warned not to take insulin without proper monitoring per orders. - Adjustment parameters are given to her for hypo and hyperglycemia in writing. - she is encouraged to call clinic for blood glucose levels less than 70 or above 300 mg /dl.  -Due to her type 1 diagnosis, she is not a candidate for non-insulin therapies.  - Specific targets for  A1c; LDL, HDL, and Triglycerides were discussed with the patient.  2) Blood Pressure /Hypertension:  her blood pressure is controlled to target without the use of antihypertensive medications.     3) Lipids/Hyperlipidemia:    Review of her recent lipid panel from 12/04/21 showed controlled LDL at 57.  She is not currently on any lipid lowering medications.   4)  Weight/Diet:  her Body mass index is 24.8 kg/m.  -   she is NOT a candidate for weight loss.  Exercise, and detailed carbohydrates information provided  -  detailed on discharge instructions.  5) Chronic Care/Health Maintenance: -she is not on ACEI/ARB or Statin medications and is encouraged to initiate and continue to follow up with Ophthalmology, Dentist, Podiatrist at least yearly or according to recommendations, and advised to stay away from smoking. I have recommended yearly flu vaccine and pneumonia vaccine at least every 5 years; moderate intensity exercise for up to 150 minutes weekly; and sleep for at least 7 hours a day.  - she is advised to maintain close follow up with Berkley Harvey, NP for primary care needs, as well as her other providers for  optimal and coordinated care.     I spent 47 minutes in the care of the patient today  including review of labs from [facility], Lipids, Thyroid Function, Hematology (current and previous including abstractions from other facilities); face-to-face time discussing  her blood glucose readings/logs, discussing hypoglycemia and hyperglycemia episodes and symptoms, medications doses, her options of short and long term treatment based on the latest standards of care / guidelines;  discussion about incorporating lifestyle medicine;  and documenting the encounter. Risk reduction counseling performed per USPSTF guidelines to reduce obesity and cardiovascular risk factors.     Please refer to Patient Instructions for Blood Glucose Monitoring and Insulin/Medications Dosing Guide"  in media tab for additional information. Please  also refer to " Patient Self Inventory" in the Media  tab for reviewed elements of pertinent patient history.  Alisha Church participated in the discussions, expressed understanding, and voiced agreement with the above plans.  All questions were answered to her satisfaction. she is encouraged to contact clinic should she have any questions or concerns prior to her return visit.     Follow up plan: - Return in about 3 months (around 04/23/2022) for Diabetes F/U with A1c in office, No previsit labs, Bring meter and logs.   Rayetta Pigg, [facility] [address] [place], [place] [address] Phone: [phone] Fax: [phone]  01/22/2022, 2:17 PM

## 2022-01-30 ENCOUNTER — Other Ambulatory Visit: Payer: Self-pay

## 2022-01-30 ENCOUNTER — Inpatient Hospital Stay: Payer: BC Managed Care – PPO

## 2022-01-30 ENCOUNTER — Inpatient Hospital Stay: Payer: BC Managed Care – PPO | Attending: Oncology | Admitting: Oncology

## 2022-01-30 VITALS — BP 120/71 | HR 87 | Temp 98.3°F | Resp 16 | Wt 135.3 lb

## 2022-01-30 DIAGNOSIS — D500 Iron deficiency anemia secondary to blood loss (chronic): Secondary | ICD-10-CM

## 2022-01-30 DIAGNOSIS — N921 Excessive and frequent menstruation with irregular cycle: Secondary | ICD-10-CM | POA: Insufficient documentation

## 2022-01-30 DIAGNOSIS — N938 Other specified abnormal uterine and vaginal bleeding: Secondary | ICD-10-CM | POA: Diagnosis not present

## 2022-01-30 DIAGNOSIS — D649 Anemia, unspecified: Secondary | ICD-10-CM | POA: Diagnosis present

## 2022-01-30 DIAGNOSIS — E119 Type 2 diabetes mellitus without complications: Secondary | ICD-10-CM | POA: Insufficient documentation

## 2022-01-30 LAB — CBC WITH DIFFERENTIAL (CANCER CENTER ONLY)
Abs Immature Granulocytes: 0.02 10*3/uL (ref 0.00–0.07)
Basophils Absolute: 0.1 10*3/uL (ref 0.0–0.1)
Basophils Relative: 1 %
Eosinophils Absolute: 0.1 10*3/uL (ref 0.0–0.5)
Eosinophils Relative: 2 %
HCT: 32.4 % — ABNORMAL LOW (ref 36.0–46.0)
Hemoglobin: 10.2 g/dL — ABNORMAL LOW (ref 12.0–15.0)
Immature Granulocytes: 0 %
Lymphocytes Relative: 33 %
Lymphs Abs: 2.5 10*3/uL (ref 0.7–4.0)
MCH: 21.2 pg — ABNORMAL LOW (ref 26.0–34.0)
MCHC: 31.5 g/dL (ref 30.0–36.0)
MCV: 67.4 fL — ABNORMAL LOW (ref 80.0–100.0)
Monocytes Absolute: 0.4 10*3/uL (ref 0.1–1.0)
Monocytes Relative: 6 %
Neutro Abs: 4.4 10*3/uL (ref 1.7–7.7)
Neutrophils Relative %: 58 %
Platelet Count: 350 10*3/uL (ref 150–400)
RBC: 4.81 MIL/uL (ref 3.87–5.11)
RDW: 16.1 % — ABNORMAL HIGH (ref 11.5–15.5)
WBC Count: 7.6 10*3/uL (ref 4.0–10.5)
nRBC: 0 % (ref 0.0–0.2)

## 2022-01-30 LAB — DIRECT ANTIGLOBULIN TEST (NOT AT ARMC)
DAT, IgG: NEGATIVE
DAT, complement: NEGATIVE

## 2022-01-30 LAB — FERRITIN: Ferritin: 7 ng/mL — ABNORMAL LOW (ref 11–307)

## 2022-01-30 LAB — TECHNOLOGIST SMEAR REVIEW

## 2022-01-30 LAB — RETICULOCYTES
Immature Retic Fract: 11.7 % (ref 2.3–15.9)
RBC.: 4.79 MIL/uL (ref 3.87–5.11)
Retic Count, Absolute: 67.1 10*3/uL (ref 19.0–186.0)
Retic Ct Pct: 1.4 % (ref 0.4–3.1)

## 2022-01-30 LAB — PROTIME-INR
INR: 1 (ref 0.8–1.2)
Prothrombin Time: 13 seconds (ref 11.4–15.2)

## 2022-01-30 LAB — APTT: aPTT: 32 seconds (ref 24–36)

## 2022-01-30 LAB — VITAMIN B12: Vitamin B-12: 255 pg/mL (ref 180–914)

## 2022-01-30 LAB — FOLATE: Folate: 11.9 ng/mL (ref >5.9)

## 2022-01-30 NOTE — Progress Notes (Unsigned)
[facility] Initial Visit:  Patient Care Team: Berkley Harvey, NP as PCP - General (Nurse Practitioner) Barbee Cough, MD as Consulting Physician (Hematology)  CHIEF COMPLAINTS/PURPOSE OF CONSULTATION:  HISTORY OF PRESENTING ILLNESS: Alisha Church 22 y.o. female is here because of anemia  Medical history notable for diabetes mellitus type 1  December 04, 2021: WBC 8.3 hemoglobin 9.5 MCV 68 platelet count 288; 62 segs 28 lymphs 7 monos 3 eos 1 basophil.  Ferritin 6 CMP notable for glucose of 248 sodium 133  January 30 2022:  [facility]  Patient is G0 P0.  Menopause not reached.  Menses occur every 4 to 5 weeks.  Last 3 days.  Bleeding is heavy especially the first two days.  Does not have bleeding between periods.    Passes clots during her menses.   Patient has/does not have history of uterine fibroids/uterine abnormalities.  Has not been placed on medications to regulate her menses.  Has taken oral iron which she takes intermittently due to constipation.  Has not IV iron nor required PRBC's in the past.  Has a normal diet.  No hematochezia, melena, hemoptysis, hematuria.  No history of intra-articular or soft tissue bleeding.  No history of abnormal bleeding in family members Patient has symptoms of fatigue, pallor,  DOE, decreased performance status.  Has ice pica, and cravings to eat dirt.  Social:  Restaurant manager, fast food.  Tobacco none.  EtOH rare  [facility] Mother alive 13 well Father alive 42 sickle cell trait Sister alive 73 well       Review of Systems - Oncology  MEDICAL HISTORY: Past Medical History:  Diagnosis Date   Dehydration    Diabetes mellitus    Diabetes mellitus without complication ([place])    Phreesia 01/10/2020   Ketonuria    Type 1 diabetes mellitus not at goal [facility])     SURGICAL HISTORY: Past Surgical History:  Procedure Laterality Date   PILONIDAL CYST / SINUS EXCISION      SOCIAL HISTORY: Social History    Socioeconomic History   Marital status: Single    Spouse name: Not on file   Number of children: Not on file   Years of education: Not on file   Highest education level: Not on file  Occupational History   Not on file  Tobacco Use   Smoking status: Never   Smokeless tobacco: Never  Substance and Sexual Activity   Alcohol use: No   Drug use: No   Sexual activity: Never  Other Topics Concern   Not on file  Social History Narrative   Graduated from [employer] [facility] [employer] 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    [employer]    Social Determinants of Health   Financial Resource Strain: Not on file  Food Insecurity: Not on file  Transportation Needs: Not on file  Physical Activity: Not on file  Stress: Not on file  Social Connections: Not on file  Intimate Partner Violence: Not on file    FAMILY HISTORY Family History  Problem Relation Age of Onset   Thyroid disease Mother    Diabetes Maternal Aunt    Heart disease Maternal Aunt    Hypothyroidism Maternal Grandmother    Thyroid disease Maternal Grandmother    Heart disease Maternal Grandmother    Diabetes Cousin     ALLERGIES:  has No Known Allergies.  MEDICATIONS:  Current Outpatient Medications  Medication Sig Dispense Refill   Continuous Blood Gluc Sensor (DEXCOM G7  SENSOR) MISC Inject 1 Application into the skin as directed. Change sensor every 10 days as directed. 6 each 3   ferrous sulfate 324 (65 Fe) MG TBEC Take one tablet or capsule daily. 30 tablet 5   Multiple Vitamin (MULTIVITAMIN WITH MINERALS) TABS tablet Take 1 tablet by mouth daily.     NOVOLOG 100 UNIT/ML injection INJECT 300 UNITS INTO PUMP EVERY 48 HOURS AS DIRECTED 30 mL 7   No current facility-administered medications for this visit.    PHYSICAL EXAMINATION:  ECOG PERFORMANCE STATUS: 0 - Asymptomatic   Vitals:   01/30/22 1451  BP: 120/71  Pulse: 87  Resp: 16  Temp: 98.3 F (36.8 C)  SpO2: 100%    Filed Weights   01/30/22 1451  Weight: 135  lb 4.8 oz (61.4 kg)     Physical Exam Vitals and nursing note reviewed.  Constitutional:      General: She is not in acute distress.    Appearance: Normal appearance. She is not ill-appearing, toxic-appearing or diaphoretic.     Comments: Here alone.  Looks well  HENT:     Head: Normocephalic and atraumatic.     Right Ear: External ear normal.     Left Ear: External ear normal.     Nose: Nose normal. No congestion or rhinorrhea.  Eyes:     General: No scleral icterus.    Extraocular Movements: Extraocular movements intact.     Conjunctiva/sclera: Conjunctivae normal.     Pupils: Pupils are equal, round, and reactive to light.  Cardiovascular:     Rate and Rhythm: Normal rate and regular rhythm.     Heart sounds: No murmur heard.    No friction rub. No gallop.  Pulmonary:     Effort: Pulmonary effort is normal. No respiratory distress.     Breath sounds: Normal breath sounds. No stridor. No wheezing or rhonchi.  Abdominal:     General: Bowel sounds are normal.     Palpations: Abdomen is soft.     Tenderness: There is no abdominal tenderness. There is no guarding or rebound.  Musculoskeletal:        General: No swelling, tenderness or deformity.     Cervical back: Normal range of motion and neck supple. No rigidity or tenderness.  Lymphadenopathy:     Head:     Right side of head: No submental, submandibular, tonsillar, preauricular, posterior auricular or occipital adenopathy.     Left side of head: No submental, submandibular, tonsillar, preauricular, posterior auricular or occipital adenopathy.     Cervical: No cervical adenopathy.     Right cervical: No superficial, deep or posterior cervical adenopathy.    Left cervical: No superficial, deep or posterior cervical adenopathy.     Upper Body:     Right upper body: No supraclavicular, axillary, pectoral or epitrochlear adenopathy.     Left upper body: No supraclavicular, axillary, pectoral or epitrochlear adenopathy.   Skin:    General: Skin is warm.     Coloration: Skin is not jaundiced or pale.     Findings: No bruising.  Neurological:     General: No focal deficit present.     Mental Status: She is alert and oriented to person, place, and time.     Cranial Nerves: No cranial nerve deficit.     Gait: Gait normal.  Psychiatric:        Mood and Affect: Mood normal.        Behavior: Behavior normal.  Thought Content: Thought content normal.        Judgment: Judgment normal.      LABORATORY DATA: I have personally reviewed the data as listed:  Clinical Support on 01/30/2022  Component Date Value Ref Range Status   WBC MORPHOLOGY 01/30/2022 MORPHOLOGY UNREMARKABLE   Final   RBC MORPHOLOGY 01/30/2022 MORPHOLOGY UNREMARKABLE   Final   Plt Morphology 01/30/2022 LARGE PLATELETS   Final   Clinical Information 01/30/2022 anemia   Final   Performed at [facility], [address] [place], [place] [address]   Coagulation Factor VIII 01/30/2022 171 (H)  56 - 140 % Final   Comment: (NOTE) FVIII activity can increase in a variety of clinical situations including normal pregnancy, in samples drawn from patients (particularly children) who are visibly stressed at the time of phlebotomy, as acute phase reactants, or in response to certain drug therapies such as DDAVP.  Persistently elevated FVIII activity is a risk factor for venous thrombosis as well as recurrence of venous thrombosis.  Risk is graded and increases with the degree of elevation.  Although elevated FVIII activity has been identified to cluster within families, a genetic basis for the elevation has not yet been elucidated (Br J Haematol. 2012; 604:540-981157:653-663).    Ristocetin Co-factor, Plasma 01/30/2022 63  50 - 200 % Final   Comment: (NOTE) Performed At: [facility] [place] [address] [place], [place] [id number] Jolene SchimkeNagendra Sanjai MD AO:[phone]    Von Willebrand Antigen, Plasma 01/30/2022 140  50 - 200  % Final   Comment: (NOTE) This test was developed and its performance characteristics determined by [facility]. It has not been cleared or approved by the Food and Drug Administration.    Prothrombin Time 01/30/2022 13.0  11.4 - 15.2 seconds Final   INR 01/30/2022 1.0  0.8 - 1.2 Final   Comment: (NOTE) INR goal varies based on device and disease states. Performed at [facility], [address] [place], [place] [address]    aPTT 01/30/2022 32  24 - 36 seconds Final   Performed at [facility], [address] [place], [place] [address]   DAT, complement 01/30/2022 NEG   Final   DAT, IgG 01/30/2022    Final                   Value:NEG Performed at [facility], [address] [place], [place] [address]    Vitamin B-12 01/30/2022 255  180 - 914 pg/mL Final   Comment: (NOTE) This assay is not validated for testing neonatal or myeloproliferative syndrome specimens for Vitamin B12 levels. Performed at [facility], [address] [place], [place] [address]    Hgb F 01/30/2022 0.0  0.0 - 2.0 % Final   Hgb A 01/30/2022 65.8 (L)  96.4 - 98.8 % Final   Hgb A2 01/30/2022 2.8  1.8 - 3.2 % Final   Hgb S 01/30/2022 31.4 (H)  0.0 % Final   Interpretation, Hgb Fract 01/30/2022 Comment   Final   Comment: (NOTE) Reflex to Hgb Solubility indicated for confirmation. Performed At: [facility] [place] [address] [place], [place] [id number] Jolene SchimkeNagendra Sanjai MD QQ:[phone]    Retic Ct Pct 01/30/2022 1.4  0.4 - 3.1 % Final   RBC. 01/30/2022 4.79  3.87 - 5.11 MIL/uL Final   Retic Count, Absolute 01/30/2022 67.1  19.0 - 186.0 K/uL Final   Immature Retic Fract 01/30/2022 11.7  2.3 - 15.9 %  Final   Performed at [facility], [address], [place], [place] [address]   Haptoglobin 01/30/2022 137  33 - 278 mg/dL Final   Comment: (NOTE) Performed At: [facility] [address]  [place], [place] [id number] Jolene Schimke MD FW:[id number]    Folate 01/30/2022 11.9  >5.9 ng/mL Final   Performed at [facility], [address] [place], [place] [address]   Ferritin 01/30/2022 7 (L)  11 - 307 ng/mL Final   Performed at [facility], [address], [place], [place] [address]   WBC Count 01/30/2022 7.6  4.0 - 10.5 K/uL Final   RBC 01/30/2022 4.81  3.87 - 5.11 MIL/uL Final   Hemoglobin 01/30/2022 10.2 (L)  12.0 - 15.0 g/dL Final   Comment: Reticulocyte Hemoglobin testing may be clinically indicated, consider ordering this additional test [id number]    HCT 01/30/2022 32.4 (L)  36.0 - 46.0 % Final   MCV 01/30/2022 67.4 (L)  80.0 - 100.0 fL Final   MCH 01/30/2022 21.2 (L)  26.0 - 34.0 pg Final   MCHC 01/30/2022 31.5  30.0 - 36.0 g/dL Final   RDW 09/62/8366 16.1 (H)  11.5 - 15.5 % Final   Platelet Count 01/30/2022 350  150 - 400 K/uL Final   nRBC 01/30/2022 0.0  0.0 - 0.2 % Final   Neutrophils Relative % 01/30/2022 58  % Final   Neutro Abs 01/30/2022 4.4  1.7 - 7.7 K/uL Final   Lymphocytes Relative 01/30/2022 33  % Final   Lymphs Abs 01/30/2022 2.5  0.7 - 4.0 K/uL Final   Monocytes Relative 01/30/2022 6  % Final   Monocytes Absolute 01/30/2022 0.4  0.1 - 1.0 K/uL Final   Eosinophils Relative 01/30/2022 2  % Final   Eosinophils Absolute 01/30/2022 0.1  0.0 - 0.5 K/uL Final   Basophils Relative 01/30/2022 1  % Final   Basophils Absolute 01/30/2022 0.1  0.0 - 0.1 K/uL Final   WBC Morphology 01/30/2022 MORPHOLOGY UNREMARKABLE   Final   RBC Morphology 01/30/2022 MORPHOLOGY UNREMARKABLE   Final   Smear Review 01/30/2022 LARGE PLATELETS   Final   Immature Granulocytes 01/30/2022 0  % Final   Abs Immature Granulocytes 01/30/2022 0.02  0.00 - 0.07 K/uL Final   Performed at [facility], [address], [place], [place] [address]   Interpretation 01/30/2022 Note   Final   Comment:  (NOTE) ------------------------------- COAGULATION: VON WILLEBRAND FACTOR ASSESSMENT CURRENT RESULTS ASSESSMENT The VWF:Ag is normal. The VWF:RCo is normal. The FVIII is elevated. VON WILLEBRAND FACTOR ASSESSMENT CURRENT RESULTS INTERPRETATION - These results are not consistent with a diagnosis of VWD according to the current NHLBI guideline. Persistently elevated FVIII activity is a risk factor for venous thrombosis as well as recurrence of venous thrombosis. Risk is graded and increases with the degree of elevation. Although elevated FVIII activity has been identified to cluster within families, a genetic basis for the elevation has not yet been elucidated (Br J Haematol. 2012; 157(6):653-663). VON WILLEBRAND FACTOR ASSESSMENT - Results may be falsely elevated and possibly falsely normal as VWF and FVIII may increase in pregnancy, in samples drawn from patients (particularly children) who are visibly stressed at the time of phlebotomy, as acute phase reactants                          , or in response to certain drug therapies such as desmopressin. Repeat  testing may be necessary before excluding a diagnosis of VWD especially if the clinical suspicion is high for an underlying bleeding disorder. The setting for phlebotomy should be as calm as possible and patients should be encouraged to sit quietly prior to the blood draw. VON WILLEBRAND FACTOR ASSESSMENT DEFINITIONS - VWD - von Willebrand disease; VWF - von Willebrand factor; VWF:Ag - VWF antigen; VWF:RCo - VWF ristocetin cofactor activity; FVIII - factor VIII activity. MEDICAL DIRECTOR: For questions regarding panel interpretation, please contact Lebron Conners, M.D. at [facility] at [phone]. ------------------------------- DISCLAIMER These assessments and interpretations are provided as a convenience in support of the physician-patient relationship and are not intended to replace the  physician's clinical judgment. They are derived from national guidelines in addition                           to other evidence and expert opinion. The clinician should consider this information within the context of clinical opinion and the individual patient. SEE GUIDANCE FOR VON WILLEBRAND FACTOR ASSESSMENT: (1) The National Heart, Lung and Blood Institute. The Diagnosis, Evaluation and Management of von Willebrand Disease. Lafe Garin, MD: [employer] Publication [phone]. 2007. Available at http://kemp.com/. (2) Annie Sable et al. Othella Boyer J Hematol. 2009; 84(6):366-370. (3) Laffan M et al. Haemophilia. 2004;10(3):199-217. (4) Pasi KJ et al. Haemophilia. 2004; 10(3):218-231. Performed At: [facility] [address] [place], [place] [id number] Blanchie Serve MD IR:[id number]    Hgb Solubility 01/30/2022 Positive (A)  Negative Final   Final Interpretation: 01/30/2022 Comment   Final   Comment: (NOTE) Hemoglobin pattern and concentrations are consistent with sickle cell trait (heterozygous). Suggest clinical and hematologic correlation.         Sickle Trait Interpretation Ranges         Hgb A       50.0 - 70.0%         Hgb S       30.0 - 45.0%         Hgb A2       1.8 -  4.0% Performed At: [facility] [place] [address] [place], [place] [id number] Jolene Schimke MD HB:[id number]   Office Visit on 01/22/2022  Component Date Value Ref Range Status   Hemoglobin A1C 01/22/2022 8.6 (A)  4.0 - 5.6 % Final   Hemoglobin 12/04/2021 9.5 (A)  12.0 - 16.0 Final   HCT 12/04/2021 31 (A)  36 - 46 Final   BUN 12/04/2021 11  4 - 21 Final   Creatinine 12/04/2021 0.8  0.5 - 1.1 Final   eGFR 12/04/2021 100   Final   Calcium 12/04/2021 9.0  8.7 - 10.7 Final   Triglycerides 12/04/2021 55  40 - 160 Final   LDL Cholesterol 12/04/2021 57   Final    RADIOGRAPHIC STUDIES: I have personally reviewed the radiological images as listed and agree  with the findings in the report  No results found.  ASSESSMENT/PLAN  22 y.o. female is here because of anemia.  Medical history notable for diabetes mellitus type 1  Anemia:  Most likely etiology is iron deficiency anemia owing to dysfunctional uterine bleeding.  Will obtain CBC with diff, CMP, Ferritin, B12, folate, retic count,  DAT, Haptoglobin  Dysfunctional uterine bleeding:  This is characterized by menses that are prolonged, irregular as well as by heavy bleeding with passage of clots.  Will evaluate for possible bleeding disorder with PT, PTT, Fibrinogen, von Willebrand screen.  Refer to gynecology for evaluation and management  Therapeutics:  Since patient has symptomatic anemia with Hgb < 10  and has not tolerated/been compliant with oral iron will arrange for IV iron replacement.   Given the severe symptoms we prefer to replete iron stores in one or two visits rather than over the course of several months.  In addition ongoing blood loss exceeds the capacity of oral iron to meet needs. A discussion regarding risks was had with the patient.  IV iron has the potential to cause allergic reactions, including potentially life-threatening anaphylaxis.   IV iron may be associated with non-allergic infusion reactions including self-limiting urticaria, palpitations, dizziness, and neck and back spasm; generally, these occur in <1 percent of individuals and do not progress to more serious reactions. The non-allergic reaction consisting of flushing of the face and myalgias of the chest and back.   After discussion of the risks and benefits of IV iron therapy patient has elected to proceed with parenteral iron therapy.       Cancer Staging  No matching staging information was found for the patient.   No problem-specific Assessment & Plan notes found for this encounter.   Orders Placed This Encounter  Procedures   Ferritin    Standing Status:   Future    Number of Occurrences:   1     Standing Expiration Date:   01/31/2023   Folate    Standing Status:   Future    Number of Occurrences:   1    Standing Expiration Date:   01/31/2023   Haptoglobin    Standing Status:   Future    Number of Occurrences:   1    Standing Expiration Date:   01/31/2023   Reticulocytes    Standing Status:   Future    Number of Occurrences:   1    Standing Expiration Date:   01/31/2023   Hgb Fractionation Cascade    Standing Status:   Future    Number of Occurrences:   1    Standing Expiration Date:   01/31/2023   Vitamin B12    Standing Status:   Future    Number of Occurrences:   1    Standing Expiration Date:   01/31/2023   APTT    Standing Status:   Future    Number of Occurrences:   1    Standing Expiration Date:   01/31/2023   Protime-INR    Standing Status:   Future    Number of Occurrences:   1    Standing Expiration Date:   01/31/2023   Von Willebrand panel    Standing Status:   Future    Number of Occurrences:   1    Standing Expiration Date:   01/31/2023   Technologist smear review    Standing Status:   Future    Number of Occurrences:   1    Standing Expiration Date:   01/31/2023    Order Specific Question:   Clinical information:    Answer:   anemia   Ambulatory referral to Gynecology    Referral Priority:   Routine    Referral Type:   Consultation    Referral Reason:   Specialty Services Required    Requested Specialty:   Gynecology    Number of Visits Requested:   1   Direct antiglobulin test (not at [facility])    Standing Status:   Future    Number of Occurrences:   1  Standing Expiration Date:   01/31/2023    46  minutes was spent in patient care.  This included time spent preparing to see the patient (e.g., review of tests), obtaining and/or reviewing separately obtained history, counseling and educating the patient, ordering medications, tests, or procedures; documenting clinical information in the electronic or other health record, independently interpreting results and  communicating results to the patient as well as coordination of care.       All questions were answered. The patient knows to call the clinic with any problems, questions or concerns.  This note was electronically signed.    Barbee Cough, MD  02/04/2022 11:53 AM

## 2022-01-31 ENCOUNTER — Telehealth: Payer: Self-pay | Admitting: Oncology

## 2022-01-31 NOTE — Telephone Encounter (Signed)
Left patient vm regarding upcoming appointments  

## 2022-02-04 ENCOUNTER — Encounter: Payer: Self-pay | Admitting: Oncology

## 2022-02-04 LAB — VON WILLEBRAND PANEL
Coagulation Factor VIII: 171 % — ABNORMAL HIGH (ref 56–140)
Ristocetin Co-factor, Plasma: 63 % (ref 50–200)
Von Willebrand Antigen, Plasma: 140 % (ref 50–200)

## 2022-02-04 LAB — HGB FRACTIONATION CASCADE
Hgb A2: 2.8 % (ref 1.8–3.2)
Hgb A: 65.8 % — ABNORMAL LOW (ref 96.4–98.8)
Hgb F: 0 % (ref 0.0–2.0)
Hgb S: 31.4 % — ABNORMAL HIGH

## 2022-02-04 LAB — HGB SOLUBILITY: Hgb Solubility: POSITIVE — AB

## 2022-02-04 LAB — HAPTOGLOBIN: Haptoglobin: 137 mg/dL (ref 33–278)

## 2022-02-04 LAB — COAG STUDIES INTERP REPORT

## 2022-02-06 ENCOUNTER — Other Ambulatory Visit: Payer: Self-pay

## 2022-02-06 ENCOUNTER — Inpatient Hospital Stay: Payer: BC Managed Care – PPO

## 2022-02-06 VITALS — BP 115/73 | HR 92 | Temp 98.8°F | Resp 16

## 2022-02-06 DIAGNOSIS — D649 Anemia, unspecified: Secondary | ICD-10-CM | POA: Diagnosis not present

## 2022-02-06 DIAGNOSIS — D500 Iron deficiency anemia secondary to blood loss (chronic): Secondary | ICD-10-CM

## 2022-02-06 MED ORDER — ACETAMINOPHEN 325 MG PO TABS
650.0000 mg | ORAL_TABLET | Freq: Once | ORAL | Status: AC
  Administered 2022-02-06: 650 mg via ORAL
  Filled 2022-02-06: qty 2

## 2022-02-06 MED ORDER — SODIUM CHLORIDE 0.9 % IV SOLN: Freq: Once | INTRAVENOUS | Status: AC

## 2022-02-06 MED ORDER — SODIUM CHLORIDE 0.9 % IV SOLN
510.0000 mg | Freq: Once | INTRAVENOUS | Status: AC
  Administered 2022-02-06: 510 mg via INTRAVENOUS
  Filled 2022-02-06: qty 510

## 2022-02-06 MED ORDER — LORATADINE 10 MG PO TABS
10.0000 mg | ORAL_TABLET | Freq: Once | ORAL | Status: AC
  Administered 2022-02-06: 10 mg via ORAL
  Filled 2022-02-06: qty 1

## 2022-02-06 NOTE — Patient Instructions (Signed)

## 2022-02-10 ENCOUNTER — Encounter: Payer: Self-pay | Admitting: Oncology

## 2022-02-27 ENCOUNTER — Encounter: Payer: Self-pay | Admitting: *Deleted

## 2022-02-27 ENCOUNTER — Encounter: Payer: Self-pay | Admitting: Oncology

## 2022-02-27 ENCOUNTER — Inpatient Hospital Stay: Payer: BC Managed Care – PPO | Attending: Oncology | Admitting: Oncology

## 2022-02-27 ENCOUNTER — Inpatient Hospital Stay: Payer: BC Managed Care – PPO

## 2022-02-27 ENCOUNTER — Other Ambulatory Visit: Payer: Self-pay | Admitting: *Deleted

## 2022-02-27 VITALS — BP 110/74 | HR 78 | Temp 98.3°F | Resp 16 | Wt 136.6 lb

## 2022-02-27 DIAGNOSIS — E109 Type 1 diabetes mellitus without complications: Secondary | ICD-10-CM | POA: Diagnosis not present

## 2022-02-27 DIAGNOSIS — Z794 Long term (current) use of insulin: Secondary | ICD-10-CM | POA: Insufficient documentation

## 2022-02-27 DIAGNOSIS — N938 Other specified abnormal uterine and vaginal bleeding: Secondary | ICD-10-CM | POA: Diagnosis not present

## 2022-02-27 DIAGNOSIS — D509 Iron deficiency anemia, unspecified: Secondary | ICD-10-CM | POA: Insufficient documentation

## 2022-02-27 DIAGNOSIS — D500 Iron deficiency anemia secondary to blood loss (chronic): Secondary | ICD-10-CM | POA: Diagnosis not present

## 2022-02-27 DIAGNOSIS — Z79899 Other long term (current) drug therapy: Secondary | ICD-10-CM | POA: Diagnosis not present

## 2022-02-27 DIAGNOSIS — D573 Sickle-cell trait: Secondary | ICD-10-CM | POA: Diagnosis not present

## 2022-02-27 LAB — CBC WITH DIFFERENTIAL/PLATELET
Abs Immature Granulocytes: 0.02 10*3/uL (ref 0.00–0.07)
Basophils Absolute: 0 10*3/uL (ref 0.0–0.1)
Basophils Relative: 0 %
Eosinophils Absolute: 0.1 10*3/uL (ref 0.0–0.5)
Eosinophils Relative: 1 %
HCT: 32.9 % — ABNORMAL LOW (ref 36.0–46.0)
Hemoglobin: 10.6 g/dL — ABNORMAL LOW (ref 12.0–15.0)
Immature Granulocytes: 0 %
Lymphocytes Relative: 27 %
Lymphs Abs: 1.8 10*3/uL (ref 0.7–4.0)
MCH: 23.3 pg — ABNORMAL LOW (ref 26.0–34.0)
MCHC: 32.2 g/dL (ref 30.0–36.0)
MCV: 72.3 fL — ABNORMAL LOW (ref 80.0–100.0)
Monocytes Absolute: 0.4 10*3/uL (ref 0.1–1.0)
Monocytes Relative: 6 %
Neutro Abs: 4.5 10*3/uL (ref 1.7–7.7)
Neutrophils Relative %: 66 %
Platelets: 267 10*3/uL (ref 150–400)
RBC: 4.55 MIL/uL (ref 3.87–5.11)
RDW: 20.7 % — ABNORMAL HIGH (ref 11.5–15.5)
WBC: 6.9 10*3/uL (ref 4.0–10.5)
nRBC: 0 % (ref 0.0–0.2)

## 2022-02-27 NOTE — Progress Notes (Signed)
[facility] Follow up Visit:  Patient Care Team: Berkley Harvey, NP as PCP - General (Nurse Practitioner) Barbee Cough, MD as Consulting Physician (Hematology)  CHIEF COMPLAINTS/PURPOSE OF CONSULTATION:  HISTORY OF PRESENTING ILLNESS: Alisha Church 22 y.o. female is here because of anemia  Medical history notable for diabetes mellitus type 1  December 04, 2021: WBC 8.3 hemoglobin 9.5 MCV 68 platelet count 288; 62 segs 28 lymphs 7 monos 3 eos 1 basophil.  Ferritin 6 CMP notable for glucose of 248 sodium 133  January 30 2022:  [facility]  Patient is G0 P0.  Menopause not reached.  Menses occur every 4 to 5 weeks.  Last 3 days.  Bleeding is heavy especially the first two days.  Does not have bleeding between periods.    Passes clots during her menses.   Patient has/does not have history of uterine fibroids/uterine abnormalities.  Has not been placed on medications to regulate her menses.  Has taken oral iron which she takes intermittently due to constipation.  Has not IV iron nor required PRBC's in the past.  Has a normal diet.  No hematochezia, melena, hemoptysis, hematuria.  No history of intra-articular or soft tissue bleeding.  No history of abnormal bleeding in family members Patient has symptoms of fatigue, pallor,  DOE, decreased performance status.  Has ice pica, and cravings to eat dirt.  Social:  Restaurant manager, fast food.  Tobacco none.  EtOH rare  [facility] Mother alive 46 well Father alive 84 sickle cell trait Sister alive 8 well  WBC 7.6 hemoglobin 10.2 MCV 67 platelet count 350; 58 segs 33 lymphs 6 monos 2 eos 1 basophil Reticulocyte count 1.4%.  Hemoglobin electrophoresis demonstrated hemoglobin AS [place] test negative haptoglobin 137 Factor VIII 171 von Willebrand factor antigen 140 ristocetin cofactor 63 INR 1.0 PTT 32 Ferritin 7 folate 11.9 B12 255  February 06, 2022 Feraheme 510 mg      February 27 2022:  Scheduled follow up for  management of anemia.  Reviewed results of labs with patient.  Sugars have been good with more lows recently in part due to decreased eating.  No highs.  Fatigued but doing work, Dietitian.     Review of Systems - Oncology  MEDICAL HISTORY: Past Medical History:  Diagnosis Date   Dehydration    Diabetes mellitus    Diabetes mellitus without complication ([place])    Phreesia 01/10/2020   Ketonuria    Type 1 diabetes mellitus not at goal [facility])     SURGICAL HISTORY: Past Surgical History:  Procedure Laterality Date   PILONIDAL CYST / SINUS EXCISION      SOCIAL HISTORY: Social History   Socioeconomic History   Marital status: Single    Spouse name: Not on file   Number of children: Not on file   Years of education: Not on file   Highest education level: Not on file  Occupational History   Not on file  Tobacco Use   Smoking status: Never   Smokeless tobacco: Never  Substance and Sexual Activity   Alcohol use: No   Drug use: No   Sexual activity: Never  Other Topics Concern   Not on file  Social History Narrative   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    [employer] for Nursing    Social Determinants of Health   Financial Resource Strain: Not on file  Food Insecurity: Not on file  Transportation Needs: Not on  file  Physical Activity: Not on file  Stress: Not on file  Social Connections: Not on file  Intimate Partner Violence: Not on file    FAMILY HISTORY Family History  Problem Relation Age of Onset   Thyroid disease Mother    Diabetes Maternal Aunt    Heart disease Maternal Aunt    Hypothyroidism Maternal Grandmother    Thyroid disease Maternal Grandmother    Heart disease Maternal Grandmother    Diabetes Cousin     ALLERGIES:  has No Known Allergies.  MEDICATIONS:  Current Outpatient Medications  Medication Sig Dispense Refill   Continuous Blood Gluc Sensor (DEXCOM G7 SENSOR) MISC Inject 1 Application into the skin as  directed. Change sensor every 10 days as directed. 6 each 3   ferrous sulfate 324 (65 Fe) MG TBEC Take one tablet or capsule daily. 30 tablet 5   Multiple Vitamin (MULTIVITAMIN WITH MINERALS) TABS tablet Take 1 tablet by mouth daily.     NOVOLOG 100 UNIT/ML injection INJECT 300 UNITS INTO PUMP EVERY 48 HOURS AS DIRECTED 30 mL 7   No current facility-administered medications for this visit.    PHYSICAL EXAMINATION:  ECOG PERFORMANCE STATUS: 0 - Asymptomatic   There were no vitals filed for this visit.   There were no vitals filed for this visit.    Physical Exam Vitals and nursing note reviewed.  Constitutional:      General: She is not in acute distress.    Appearance: Normal appearance. She is not ill-appearing, toxic-appearing or diaphoretic.     Comments: Here alone.  Looks well  HENT:     Head: Normocephalic and atraumatic.     Right Ear: External ear normal.     Left Ear: External ear normal.     Nose: Nose normal. No congestion or rhinorrhea.  Eyes:     General: No scleral icterus.    Extraocular Movements: Extraocular movements intact.     Conjunctiva/sclera: Conjunctivae normal.     Pupils: Pupils are equal, round, and reactive to light.  Cardiovascular:     Rate and Rhythm: Normal rate and regular rhythm.     Heart sounds: No murmur heard.    No friction rub. No gallop.  Pulmonary:     Effort: Pulmonary effort is normal. No respiratory distress.     Breath sounds: Normal breath sounds. No stridor. No wheezing or rhonchi.  Abdominal:     General: Bowel sounds are normal.     Palpations: Abdomen is soft.     Tenderness: There is no abdominal tenderness. There is no guarding or rebound.  Musculoskeletal:        General: No swelling, tenderness or deformity.     Cervical back: Normal range of motion and neck supple. No rigidity or tenderness.  Lymphadenopathy:     Head:     Right side of head: No submental, submandibular, tonsillar, preauricular, posterior  auricular or occipital adenopathy.     Left side of head: No submental, submandibular, tonsillar, preauricular, posterior auricular or occipital adenopathy.     Cervical: No cervical adenopathy.     Right cervical: No superficial, deep or posterior cervical adenopathy.    Left cervical: No superficial, deep or posterior cervical adenopathy.     Upper Body:     Right upper body: No supraclavicular, axillary, pectoral or epitrochlear adenopathy.     Left upper body: No supraclavicular, axillary, pectoral or epitrochlear adenopathy.  Skin:    General: Skin is warm.  Coloration: Skin is not jaundiced or pale.     Findings: No bruising.  Neurological:     General: No focal deficit present.     Mental Status: She is alert and oriented to person, place, and time.     Cranial Nerves: No cranial nerve deficit.     Gait: Gait normal.  Psychiatric:        Mood and Affect: Mood normal.        Behavior: Behavior normal.        Thought Content: Thought content normal.        Judgment: Judgment normal.      LABORATORY DATA: I have personally reviewed the data as listed:  Clinical Support on 01/30/2022  Component Date Value Ref Range Status   WBC MORPHOLOGY 01/30/2022 MORPHOLOGY UNREMARKABLE   Final   RBC MORPHOLOGY 01/30/2022 MORPHOLOGY UNREMARKABLE   Final   Plt Morphology 01/30/2022 LARGE PLATELETS   Final   Clinical Information 01/30/2022 anemia   Final   Performed at [facility], [place] [address] [place], [place] [address]   Coagulation Factor VIII 01/30/2022 171 (H)  56 - 140 % Final   Comment: (NOTE) FVIII activity can increase in a variety of clinical situations including normal pregnancy, in samples drawn from patients (particularly children) who are visibly stressed at the time of phlebotomy, as acute phase reactants, or in response to certain drug therapies such as DDAVP.  Persistently elevated FVIII activity is a risk factor for venous thrombosis as  well as recurrence of venous thrombosis.  Risk is graded and increases with the degree of elevation.  Although elevated FVIII activity has been identified to cluster within families, a genetic basis for the elevation has not yet been elucidated (Br J Haematol. 2012; [id number]).    Ristocetin Co-factor, Plasma 01/30/2022 63  50 - 200 % Final   Comment: (NOTE) Performed At: [facility] [place], [place] [id number] Rush Farmer MD [id number]    Von Willebrand Antigen, Plasma 01/30/2022 140  50 - 200 % Final   Comment: (NOTE) This test was developed and its performance characteristics determined by [facility]. It has not been cleared or approved by the Food and Drug Administration.    Prothrombin Time 01/30/2022 13.0  11.4 - 15.2 seconds Final   INR 01/30/2022 1.0  0.8 - 1.2 Final   Comment: (NOTE) INR goal varies based on device and disease states. Performed at [facility], [place] [address], [place], [place] [address]    aPTT 01/30/2022 32  24 - 36 seconds Final   Performed at [facility], [place] [address], [place], [place] [address]   DAT, complement 01/30/2022 NEG   Final   DAT, IgG 01/30/2022    Final                   Value:NEG Performed at [facility], [place] [address] [place],  [address]    Vitamin B-12 01/30/2022 255  180 - 914 pg/mL Final   Comment: (NOTE) This assay is not validated for testing neonatal or myeloproliferative syndrome specimens for Vitamin B12 levels. Performed at [facility], [place] [address] [place], [place] [address]    Hgb F 01/30/2022 0.0  0.0 - 2.0 % Final   Hgb A 01/30/2022 65.8 (L)  96.4 - 98.8 % Final   Hgb A2 01/30/2022 2.8  1.8 - 3.2 % Final   Hgb S 01/30/2022 31.4 (H)  0.0 % Final   Interpretation, Hgb Fract 01/30/2022 Comment   Final   Comment: (NOTE) Reflex to Hgb Solubility indicated for confirmation. Performed At: [facility] [place], [place] [id number] Rush Farmer MD [id number]    Retic Ct Pct 01/30/2022 1.4  0.4 - 3.1 % Final   RBC. 01/30/2022 4.79  3.87 - 5.11 MIL/uL Final   Retic Count, Absolute 01/30/2022 67.1  19.0 - 186.0 K/uL Final   Immature Retic Fract 01/30/2022 11.7  2.3 - 15.9 % Final   Performed at [facility], [place] [address] [place], [place] [address]   Haptoglobin 01/30/2022 137  33 - 278 mg/dL Final   Comment: (NOTE) Performed At: [facility] [place], [place] [id number] Rush Farmer MD [id number]    Folate 01/30/2022 11.9  >5.9 ng/mL Final   Performed at [facility], [place] [address], [place], [place] [address]   Ferritin 01/30/2022 7 (L)  11 - 307 ng/mL Final   Performed at [place] [address] [place], [place] [address]   WBC Count 01/30/2022 7.6  4.0 - 10.5 K/uL Final   RBC 01/30/2022 4.81  3.87 - 5.11 MIL/uL Final   Hemoglobin 01/30/2022 10.2 (L)  12.0 - 15.0 g/dL Final   Comment: Reticulocyte Hemoglobin testing may be clinically indicated, consider ordering this additional test [id number]    HCT 01/30/2022 32.4 (L)  36.0 - 46.0 % Final   MCV 01/30/2022 67.4 (L)  80.0 - 100.0 fL Final   MCH 01/30/2022 21.2 (L)  26.0 - 34.0 pg Final   MCHC 01/30/2022 31.5  30.0 - 36.0 g/dL Final   RDW 01/30/2022 16.1 (H)  11.5 - 15.5 % Final   Platelet Count 01/30/2022 350  150 - 400 K/uL Final   nRBC 01/30/2022 0.0  0.0 - 0.2 % Final   Neutrophils Relative % 01/30/2022 58  % Final   Neutro Abs 01/30/2022 4.4  1.7 - 7.7 K/uL Final   Lymphocytes Relative 01/30/2022 33  % Final   Lymphs Abs 01/30/2022 2.5  0.7 - 4.0 K/uL Final   Monocytes Relative 01/30/2022 6  % Final   Monocytes Absolute 01/30/2022 0.4  0.1 - 1.0 K/uL Final   Eosinophils Relative 01/30/2022 2  % Final   Eosinophils Absolute 01/30/2022 0.1  0.0 - 0.5 K/uL Final   Basophils Relative 01/30/2022 1  %  Final   Basophils Absolute 01/30/2022 0.1  0.0 - 0.1 K/uL Final   WBC Morphology 01/30/2022 MORPHOLOGY UNREMARKABLE   Final   RBC Morphology 01/30/2022 MORPHOLOGY UNREMARKABLE   Final   Smear Review 01/30/2022 LARGE PLATELETS   Final   Immature Granulocytes 01/30/2022 0  % Final   Abs Immature Granulocytes 01/30/2022 0.02  0.00 - 0.07 K/uL Final   Performed at [facility], [place] [address], [place], [place] [address]   Interpretation 01/30/2022 Note   Final   Comment: (NOTE) ------------------------------- COAGULATION: VON WILLEBRAND FACTOR ASSESSMENT CURRENT RESULTS ASSESSMENT The VWF:Ag is normal. The VWF:RCo is normal. The FVIII is elevated. VON WILLEBRAND FACTOR ASSESSMENT CURRENT RESULTS INTERPRETATION - These results are not consistent with a diagnosis of VWD according to the current NHLBI guideline. Persistently elevated FVIII activity is a risk factor for venous thrombosis as well as recurrence of venous thrombosis. Risk is graded and increases with the degree of elevation. Although elevated FVIII activity has been identified to cluster within families, a genetic basis for the elevation  has not yet been elucidated (Br J Haematol. 2012; 157(6):653-663). VON WILLEBRAND FACTOR ASSESSMENT - Results may be falsely elevated and possibly falsely normal as VWF and FVIII may increase in pregnancy, in samples drawn from patients (particularly children) who are visibly stressed at the time of phlebotomy, as acute phase reactants                          , or in response to certain drug therapies such as desmopressin. Repeat testing may be necessary before excluding a diagnosis of VWD especially if the clinical suspicion is high for an underlying bleeding disorder. The setting for phlebotomy should be as calm as possible and patients should be encouraged to sit quietly prior to the blood draw. VON WILLEBRAND FACTOR ASSESSMENT DEFINITIONS - VWD - von  Willebrand disease; VWF - von Willebrand factor; VWF:Ag - VWF antigen; VWF:RCo - VWF ristocetin cofactor activity; FVIII - factor VIII activity. MEDICAL DIRECTOR: For questions regarding panel interpretation, please contact Jake Bathe, M.D. at [facility] at [phone]. ------------------------------- DISCLAIMER These assessments and interpretations are provided as a convenience in support of the physician-patient relationship and are not intended to replace the physician's clinical judgment. They are derived from national guidelines in addition                           to other evidence and expert opinion. The clinician should consider this information within the context of clinical opinion and the individual patient. SEE GUIDANCE FOR VON WILLEBRAND FACTOR ASSESSMENT: (1) The National Heart, Lung and Blood Institute. The Diagnosis, Evaluation and Management of von Willebrand Disease. Janeal Holmes, MD: [place] Publication [id number]. [id number]. Available at vSpecials.com.pt. (2) Daryl Eastern et al. [place] J Hematol. 2009; 84(6):366-370. (3) [place]. 2004;10(3):199-217. (4) Pasi KJ et al. Haemophilia. 2004; 10(3):218-231. Performed At: [facility] [address] 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 Suggest clinical and hematologic correlation.         Sickle Trait Interpretation Ranges         Hgb A       50.0 - 70.0%         Hgb S       30.0 - 45.0%         Hgb A2       1.8 -  4.0% Performed At: [facility] [address] [place], [place] [id number] Rush Farmer MD [id number]     RADIOGRAPHIC STUDIES: I have personally reviewed the  radiological images as listed and agree with the findings in the report  No results found.  ASSESSMENT/PLAN  22 y.o. female is here because of anemia.  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   January 30 2022:  PLT count, PT, PTT, Fibrinogen, von Willebrand screen normal.  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  Carriers usually have none of the symptoms of sickle cell anemia or other sickle cell diseases. However, knowledge of sickle cell trait is important in many settings, such as preconception counseling and evaluation of rare complications. Counselled patient about test results including as pertained to possible reproductive consequences     Cancer Staging  No matching staging information was found for the patient.   No problem-specific Assessment & Plan notes found for this encounter.   No orders of the defined types were placed in this encounter.   33  minutes was spent in patient care.  This included time spent preparing to see the patient (e.g., review of tests), obtaining and/or reviewing separately obtained history, counseling and educating the patient; documenting clinical information in the electronic or other health record, independently interpreting results and communicating results to the patient as well as coordination of care.       All questions were answered. The patient knows to call the clinic with any problems, questions or concerns.  This note was electronically signed.    Barbee Cough, MD  02/27/2022 1:39 PM

## 2022-03-01 ENCOUNTER — Encounter: Payer: Self-pay | Admitting: Nurse Practitioner

## 2022-03-02 MED ORDER — INSULIN ASPART 100 UNIT/ML IJ SOLN: INTRAMUSCULAR | 3 refills | Status: DC

## 2022-03-04 ENCOUNTER — Other Ambulatory Visit: Payer: Self-pay

## 2022-03-04 ENCOUNTER — Inpatient Hospital Stay: Payer: BC Managed Care – PPO

## 2022-03-04 VITALS — BP 122/75 | HR 106 | Temp 99.2°F

## 2022-03-04 DIAGNOSIS — D500 Iron deficiency anemia secondary to blood loss (chronic): Secondary | ICD-10-CM

## 2022-03-04 DIAGNOSIS — D509 Iron deficiency anemia, unspecified: Secondary | ICD-10-CM | POA: Diagnosis not present

## 2022-03-04 MED ORDER — SODIUM CHLORIDE 0.9 % IV SOLN: Freq: Once | INTRAVENOUS | Status: AC

## 2022-03-04 MED ORDER — ACETAMINOPHEN 325 MG PO TABS
650.0000 mg | ORAL_TABLET | Freq: Once | ORAL | Status: AC
  Administered 2022-03-04: 650 mg via ORAL
  Filled 2022-03-04: qty 2

## 2022-03-04 MED ORDER — LORATADINE 10 MG PO TABS
10.0000 mg | ORAL_TABLET | Freq: Once | ORAL | Status: AC
  Administered 2022-03-04: 10 mg via ORAL
  Filled 2022-03-04: qty 1

## 2022-03-04 MED ORDER — SODIUM CHLORIDE 0.9 % IV SOLN
510.0000 mg | Freq: Once | INTRAVENOUS | Status: AC
  Administered 2022-03-04: 510 mg via INTRAVENOUS
  Filled 2022-03-04: qty 510

## 2022-03-04 NOTE — Patient Instructions (Signed)

## 2022-03-09 ENCOUNTER — Encounter: Payer: Self-pay | Admitting: Oncology

## 2022-03-09 DIAGNOSIS — D573 Sickle-cell trait: Secondary | ICD-10-CM | POA: Insufficient documentation

## 2022-03-18 ENCOUNTER — Ambulatory Visit: Payer: BC Managed Care – PPO | Admitting: Radiology

## 2022-03-18 ENCOUNTER — Encounter: Payer: Self-pay | Admitting: Radiology

## 2022-03-18 VITALS — BP 112/72 | HR 108 | Ht 61.0 in | Wt 132.0 lb

## 2022-03-18 DIAGNOSIS — Z113 Encounter for screening for infections with a predominantly sexual mode of transmission: Secondary | ICD-10-CM

## 2022-03-18 DIAGNOSIS — N921 Excessive and frequent menstruation with irregular cycle: Secondary | ICD-10-CM

## 2022-03-18 NOTE — Progress Notes (Signed)
   Alisha Church [name] 02, 2001 [id number]   History:  22 y.o. G0 presents as a new patient, referred from oncology/hematology for dysfunctional uterine bleeding (menometrorrhagia), She is currently receiving tx for iron deficiency anemia. Last infusion received on 03/04/2022. No known hx of uterine fibroids/abnormalities. Periods are coming every 18-30 days for the past two years she has  been tracking them. Pt reports skipping December and January but had a heavy cycle this month.   Gynecologic History Patient's last menstrual period was 03/14/2022 (exact date).   Contraception/Family planning: none Sexually active: last active 7 months ago Last Pap: 11/2021. Results were: normal   Obstetric History OB History  Gravida Para Term Preterm AB Living  0 0 0 0 0 0  SAB IAB Ectopic Multiple Live Births  0 0 0 0 0     The following portions of the patient's history were reviewed and updated as appropriate: allergies, current medications, past family history, past medical history, past social history, past surgical history, and problem list.  Review of Systems Pertinent items noted in HPI and remainder of comprehensive ROS otherwise negative.   Past medical history, past surgical history, family history and social history were all reviewed and documented in the EPIC chart.   Exam:  Vitals:   03/18/22 1555  BP: 112/72  Pulse: (!) 108  SpO2: 99%  Weight: 132 lb (59.9 kg)  Height: 5' 1"$  (1.549 m)   Body mass index is 24.94 kg/m.  General appearance:  Normal Abdominal  Soft,nontender, without masses, guarding or rebound.  Liver/spleen:  No organomegaly noted  Hernia:  None appreciated  Skin  Inspection:  Grossly normal Genitourinary   Inguinal/mons:  Normal without inguinal adenopathy  External genitalia:  Normal appearing vulva with no masses, tenderness, or lesions  BUS/Urethra/Skene's glands:  Normal without masses or exudate  Vagina:  Normal appearing with normal color and  discharge, no lesions  Cervix:  Normal appearing without discharge or lesions  Uterus:  Normal in size, shape and contour.  Mobile, nontender  Adnexa/parametria:     Rt: Normal in size, without masses or tenderness.   Lt: Normal in size, without masses or tenderness.  Anus and perineum: Normal   Patient informed chaperone available to be present for breast and pelvic exam. Patient has requested no chaperone to be present. Patient has been advised what will be completed during breast and pelvic exam.   Assessment/Plan:   1. Menometrorrhagia - US Transvaginal Non-OB; Future  2. Screening for STDs (sexually transmitted diseases) - SURESWAB CT/NG/T. vaginalis      Elise Gladden B WHNP-BC 4:09 PM 03/18/2022

## 2022-03-19 LAB — SURESWAB CT/NG/T. VAGINALIS
C. trachomatis RNA, TMA: NOT DETECTED
N. gonorrhoeae RNA, TMA: NOT DETECTED
Trichomonas vaginalis RNA: NOT DETECTED

## 2022-04-17 ENCOUNTER — Inpatient Hospital Stay: Payer: BC Managed Care – PPO

## 2022-04-17 ENCOUNTER — Telehealth: Payer: Self-pay | Admitting: Oncology

## 2022-04-17 ENCOUNTER — Inpatient Hospital Stay: Payer: BC Managed Care – PPO | Admitting: Oncology

## 2022-04-17 ENCOUNTER — Other Ambulatory Visit: Payer: Self-pay | Admitting: *Deleted

## 2022-04-17 DIAGNOSIS — D500 Iron deficiency anemia secondary to blood loss (chronic): Secondary | ICD-10-CM

## 2022-04-17 DIAGNOSIS — N938 Other specified abnormal uterine and vaginal bleeding: Secondary | ICD-10-CM

## 2022-04-17 NOTE — Telephone Encounter (Signed)
Per 3/21 IB reached out to patient to reschedule; left voicemail.

## 2022-04-21 ENCOUNTER — Encounter: Payer: Self-pay | Admitting: Nurse Practitioner

## 2022-04-22 NOTE — Telephone Encounter (Signed)
See pt message

## 2022-04-23 ENCOUNTER — Ambulatory Visit: Payer: BC Managed Care – PPO | Admitting: Nurse Practitioner

## 2022-04-27 ENCOUNTER — Encounter: Payer: Self-pay | Admitting: Nurse Practitioner

## 2022-04-30 ENCOUNTER — Other Ambulatory Visit: Payer: BC Managed Care – PPO

## 2022-04-30 ENCOUNTER — Other Ambulatory Visit: Payer: BC Managed Care – PPO | Admitting: Radiology

## 2022-05-06 ENCOUNTER — Ambulatory Visit: Payer: BC Managed Care – PPO | Admitting: Nurse Practitioner

## 2022-05-13 ENCOUNTER — Encounter: Payer: Self-pay | Admitting: Nurse Practitioner

## 2022-05-13 ENCOUNTER — Ambulatory Visit: Payer: BC Managed Care – PPO | Admitting: Nurse Practitioner

## 2022-05-13 VITALS — BP 100/58 | HR 93 | Ht 61.0 in | Wt 132.2 lb

## 2022-05-13 DIAGNOSIS — E109 Type 1 diabetes mellitus without complications: Secondary | ICD-10-CM | POA: Diagnosis not present

## 2022-05-13 LAB — POCT UA - MICROALBUMIN
Creatinine, POC: 50 mg/dL
Microalbumin Ur, POC: 10 mg/L

## 2022-05-13 LAB — POCT GLYCOSYLATED HEMOGLOBIN (HGB A1C): Hemoglobin A1C: 8 % — AB (ref 4.0–5.6)

## 2022-05-13 NOTE — Progress Notes (Signed)
Endocrinology Follow Up Note       05/13/2022, 3:38 PM   Subjective:    Patient ID: Alisha Church, female    DOB: 03-22-2000.  Alisha Church is being seen in follow up after being seen in consultation for management of currently uncontrolled symptomatic diabetes requested by  Iona Hansen, NP.   Past Medical History:  Diagnosis Date   Acne    Dehydration    Diabetes mellitus    Diabetes mellitus without complication    Phreesia 01/10/2020   Ketonuria    Type 1 diabetes mellitus not at goal     Past Surgical History:  Procedure Laterality Date   PILONIDAL CYST / SINUS EXCISION      Social History   Socioeconomic History   Marital status: Single    Spouse name: Not on file   Number of children: Not on file   Years of education: Not on file   Highest education level: Not on file  Occupational History   Not on file  Tobacco Use   Smoking status: Never   Smokeless tobacco: Never  Substance and Sexual Activity   Alcohol use: No   Drug use: No   Sexual activity: Not Currently    Partners: Male    Birth control/protection: Abstinence    Comment: No hx of STIs  Other Topics Concern   Not on file  Social History Narrative   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    Social Determinants of Health   Financial Resource Strain: Not on file  Food Insecurity: Not on file  Transportation Needs: Not on file  Physical Activity: Not on file  Stress: Not on file  Social Connections: Not on file    Family History  Problem Relation Age of Onset   Thyroid disease Mother    Diabetes Maternal Aunt    Heart disease Maternal Aunt    Hypothyroidism Maternal Grandmother    Thyroid disease Maternal Grandmother    Heart disease Maternal Grandmother    Diabetes Cousin     Outpatient Encounter Medications as of 05/13/2022  Medication Sig   benzoyl peroxide (CERAVE  ACNE FOAMING CREAM) 4 % external liquid Apply topically at bedtime.   clindamycin (CLEOCIN T) 1 % lotion Apply 1 Application topically 2 (two) times daily.   ferrous sulfate 324 (65 Fe) MG TBEC Take one tablet or capsule daily.   insulin aspart (NOVOLOG) 100 UNIT/ML injection Use with Tandem insulin pump for TDD around 50 units.   Multiple Vitamin (MULTIVITAMIN WITH MINERALS) TABS tablet Take 1 tablet by mouth daily.   tacrolimus (PROTOPIC) 0.1 % ointment Apply 1 Application topically 2 (two) times daily.   tretinoin (RETIN-A) 0.025 % cream Apply topically at bedtime.   Continuous Blood Gluc Sensor (DEXCOM G7 SENSOR) MISC Inject 1 Application into the skin as directed. Change sensor every 10 days as directed. (Patient not taking: Reported on 05/13/2022)   No facility-administered encounter medications on file as of 05/13/2022.    ALLERGIES: No Known Allergies  VACCINATION STATUS: Immunization History  Administered Date(s) Administered   DTaP 03/03/1999, 05/14/1999, 07/15/1999, 05/17/2000, 04/11/2004   H1N1 01/17/2008  HIB (PRP-OMP) 03/03/1999, 05/14/1999, 07/15/1999, 05/17/2000   HPV 9-valent 07/24/2010, 09/17/2011, 12/01/2012   HPV Quadrivalent 07/24/2010, 09/17/2011, 12/01/2012   Hepatitis A 11/12/2006, 06/13/2007   Hepatitis A, Adult 11/12/2006, 06/13/2007   Hepatitis A, Ped/Adol-2 Dose 11/12/2006, 06/13/2007   Hepatitis B 1998/03/02, 03/12/1999, 07/15/1999   Hepatitis B, PED/ADOLESCENT 22-Oct-1998, 03/12/1999, 07/15/1999   Hpv-Unspecified 07/24/2010, 09/17/2011   IPV 03/12/1999, 05/14/1999, 02/24/2000, 11/23/2000, 04/11/2004   Influenza Nasal 01/17/2008   Influenza Whole 12/09/2011   Influenza,inj,Quad PF,6+ Mos 11/02/2012, 12/24/2014, 02/08/2018   Influenza,inj,quad, With Preservative 02/26/2014   Influenza-Unspecified 01/17/2008, 11/02/2012, 02/26/2014, 12/24/2014, 11/05/2021   MMR 02/24/2000, 04/11/2004   Meningococcal Conjugate 07/24/2010, 05/13/2016, 05/13/2016   PFIZER  Comirnaty(Gray Top)Covid-19 Tri-Sucrose Vaccine 10/09/2020, 11/10/2020   Pneumococcal Conjugate-13 03/03/1999, 05/14/1999, 07/15/1999, 02/24/2000   Pneumococcal Polysaccharide-23 03/03/1999, 05/14/1999   Pneumococcal-Unspecified 03/03/1999, 05/14/1999, 07/15/1999, 02/24/2000   Tdap 07/24/2010, 12/04/2021   Varicella 03/03/1999, 05/14/1999, 05/17/2000, 11/12/2006    Diabetes She presents for her follow-up diabetic visit. She has type 1 diabetes mellitus. Onset time: Diagnosed at approx age of 22. Her disease course has been fluctuating. There are no hypoglycemic associated symptoms. There are no diabetic associated symptoms. There are no hypoglycemic complications. There are no diabetic complications. Risk factors for coronary artery disease include diabetes mellitus. Current diabetic treatment includes insulin pump. She is compliant with treatment most of the time. Her weight is stable. She is following a generally healthy diet. Meal planning includes avoidance of concentrated sweets. She has had a previous visit with a dietitian. She participates in exercise intermittently. Her home blood glucose trend is fluctuating dramatically. Her overall blood glucose range is >200 mg/dl. (She presents today with her CGM and pump showing drastically fluctuating glycemic profile.  Her POCT A1c today is 8%, improving from last visit of 8.6%.  She is in the middle of nursing school and notes her daily routines are thrown way off as a result.  She has been without CGM for a few weeks due to price. Analysis of her CGM/pump shows TIR 41%, TAR 57%, TBR 2%.) An ACE inhibitor/angiotensin II receptor blocker is not being taken. She does not see a podiatrist.Eye exam is current.    Review of systems  Constitutional: + Minimally fluctuating body weight,  current Body mass index is 24.98 kg/m. , no fatigue, no subjective hyperthermia, no subjective hypothermia Eyes: no blurry vision, no xerophthalmia ENT: no sore throat,  no nodules palpated in throat, no dysphagia/odynophagia, no hoarseness Cardiovascular: no chest pain, no shortness of breath, no palpitations, no leg swelling Respiratory: no cough, no shortness of breath Gastrointestinal: no nausea/vomiting/diarrhea Musculoskeletal: no muscle/joint aches Skin: no rashes, no hyperemia Neurological: no tremors, no numbness, no tingling, no dizziness Psychiatric: no depression, no anxiety  Objective:     BP (!) 100/58 (BP Location: Left Arm, Patient Position: Sitting, Cuff Size: Large)   Pulse 93   Ht 5\' 1"  (1.549 m)   Wt 132 lb 3.2 oz (60 kg)   BMI 24.98 kg/m   Wt Readings from Last 3 Encounters:  05/13/22 132 lb 3.2 oz (60 kg)  03/18/22 132 lb (59.9 kg)  02/27/22 136 lb 9.6 oz (62 kg)     BP Readings from Last 3 Encounters:  05/13/22 (!) 100/58  03/18/22 112/72  03/04/22 122/75     Physical Exam- Limited  Constitutional:  Body mass index is 24.98 kg/m. , not in acute distress, normal state of mind Eyes:  EOMI, no exophthalmos Musculoskeletal: no gross deformities, strength intact in all four  extremities, no gross restriction of joint movements Skin:  no rashes, no hyperemia Neurological: no tremor with outstretched hands   Diabetic Foot Exam - Simple   Simple Foot Form Diabetic Foot exam was performed with the following findings: Yes 05/13/2022  3:03 PM  Visual Inspection No deformities, no ulcerations, no other skin breakdown bilaterally: Yes Sensation Testing Intact to touch and monofilament testing bilaterally: Yes Pulse Check Posterior Tibialis and Dorsalis pulse intact bilaterally: Yes Comments     CMP ( most recent) CMP     Component Value Date/Time   NA 140 03/11/2021 0813   K 4.0 03/11/2021 0813   CL 106 03/11/2021 0813   CO2 24 03/11/2021 0813   GLUCOSE 92 03/11/2021 0813   BUN 11 12/04/2021 0000   CREATININE 0.8 12/04/2021 0000   CREATININE 0.68 03/11/2021 0813   CALCIUM 9.0 12/04/2021 0000   PROT 7.3  03/11/2021 0813   ALBUMIN 4.4 04/04/2015 1433   AST 12 03/11/2021 0813   ALT 9 03/11/2021 0813   ALKPHOS 134 04/04/2015 1433   BILITOT 0.3 03/11/2021 0813   GFRNONAA NOT CALCULATED 07/19/2010 1509   GFRAA NOT CALCULATED 07/19/2010 1509     Diabetic Labs (most recent): Lab Results  Component Value Date   HGBA1C 8.0 (A) 05/13/2022   HGBA1C 8.6 (A) 01/22/2022   HGBA1C 8.7 (H) 09/17/2021   MICROALBUR 10 mg/L 05/13/2022   MICROALBUR 0.4 03/11/2021   MICROALBUR 0.4 09/13/2019     Lipid Panel ( most recent) Lipid Panel     Component Value Date/Time   CHOL 138 03/11/2021 0813   TRIG 55 12/04/2021 0000   HDL 69 03/11/2021 0813   CHOLHDL 2.0 03/11/2021 0813   VLDL 12 04/04/2015 1433   LDLCALC 57 12/04/2021 0000   LDLCALC 59 03/11/2021 0813      Lab Results  Component Value Date   TSH 2.39 09/17/2021   TSH 3.06 03/11/2021   TSH 1.39 09/13/2019   TSH 1.28 10/25/2018   TSH 1.76 06/17/2017   TSH 1.10 04/04/2015   TSH 2.191 01/18/2014   TSH 2.850 10/31/2012   TSH 2.261 12/18/2011   TSH 2.253 10/08/2010   FREET4 0.9 09/17/2021   FREET4 1.0 03/11/2021   FREET4 0.9 09/13/2019   FREET4 1.0 10/25/2018   FREET4 1.2 06/17/2017   FREET4 1.2 04/04/2015   FREET4 1.09 01/18/2014   FREET4 1.07 10/31/2012   FREET4 1.11 12/18/2011   FREET4 1.19 10/08/2010           Assessment & Plan:   1) Type 1 diabetes mellitus without complication (HCC)  She presents today with her CGM and pump showing drastically fluctuating glycemic profile.  Her POCT A1c today is 8%, improving from last visit of 8.6%.  She is in the middle of nursing school and notes her daily routines are thrown way off as a result.  She has been without CGM for a few weeks due to price. Analysis of her CGM/pump shows TIR 41%, TAR 57%, TBR 2%.  Alisha Church has currently uncontrolled symptomatic type 1 DM since 22 years of age.   -Recent labs reviewed.  - I had a long discussion with her about the progressive  nature of diabetes and the pathology behind its complications. -her diabetes is not currently complicated but she remains at a high risk for more acute and chronic complications which include CAD, CVA, CKD, retinopathy, and neuropathy. These are all discussed in detail with her.  The following Lifestyle Medicine recommendations according to  American College of Lifestyle Medicine [facility]) were discussed and offered to patient and she agrees to start the journey:  A. Whole Foods, Plant-based plate comprising of fruits and vegetables, plant-based proteins, whole-grain carbohydrates was discussed in detail with the patient.   A list for source of those nutrients were also provided to the patient.  Patient will use only water or unsweetened tea for hydration. B.  The need to stay away from risky substances including alcohol, smoking; obtaining 7 to 9 hours of restorative sleep, at least 150 minutes of moderate intensity exercise weekly, the importance of healthy social connections,  and stress reduction techniques were discussed. C.  A full color page of  Calorie density of various food groups per pound showing examples of each food groups was provided to the patient.  - Nutritional counseling repeated at each appointment due to patients tendency to fall back in to old habits.  - The patient admits there is a room for improvement in their diet and drink choices. -  Suggestion is made for the patient to avoid simple carbohydrates from their diet including Cakes, Sweet Desserts / Pastries, Ice Cream, Soda (diet and regular), Sweet Tea, Candies, Chips, Cookies, Sweet Pastries, Store Bought Juices, Alcohol in Excess of 1-2 drinks a day, Artificial Sweeteners, Coffee Creamer, and "Sugar-free" Products. This will help patient to have stable blood glucose profile and potentially avoid unintended weight gain.   - I encouraged the patient to switch to unprocessed or minimally processed complex starch and increased  protein intake (animal or plant source), fruits, and vegetables.   - Patient is advised to stick to a routine mealtimes to eat 3 meals a day and avoid unnecessary snacks (to snack only to correct hypoglycemia).  - I have approached her with the following individualized plan to manage her diabetes and patient agrees:   -I did make changes to her pump settings- seems her settings were too aggressive causing dramatic fluctuations.  My hope is to work on consistently using CGM to take full advantage of smart functions which adjusts settings based on glucose readings.  She is still considering changing to Omnipod in the future.  -she is encouraged to continue monitoring glucose 4 times daily (using her CGM), before meals and before bed.  I did give her sample Dexcom G6 today and encouraged her to look into patient assistance program for Dexcom to help cover high cost.  - she is warned not to take insulin without proper monitoring per orders. - Adjustment parameters are given to her for hypo and hyperglycemia in writing. - she is encouraged to call clinic for blood glucose levels less than 70 or above 300 mg /dl.  -Due to her type 1 diagnosis, she is not a candidate for non-insulin therapies.  - Specific targets for  A1c; LDL, HDL, and Triglycerides were discussed with the patient.  2) Blood Pressure /Hypertension:  her blood pressure is controlled to target without the use of antihypertensive medications.     3) Lipids/Hyperlipidemia:    Review of her recent lipid panel from 12/04/21 showed controlled LDL at 57.  She is not currently on any lipid lowering medications.   4)  Weight/Diet:  her Body mass index is 24.98 kg/m.  -   she is NOT a candidate for weight loss.  Exercise, and detailed carbohydrates information provided  -  detailed on discharge instructions.  5) Chronic Care/Health Maintenance: -she is not on ACEI/ARB or Statin medications and is encouraged to initiate and continue  to  follow up with Ophthalmology, Dentist, Podiatrist at least yearly or according to recommendations, and advised to stay away from smoking. I have recommended yearly flu vaccine and pneumonia vaccine at least every 5 years; moderate intensity exercise for up to 150 minutes weekly; and sleep for at least 7 hours a day.  - she is advised to maintain close follow up with Iona Hansen, NP for primary care needs, as well as her other providers for optimal and coordinated care.      I spent  41  minutes in the care of the patient today including review of labs from CMP, Lipids, Thyroid Function, Hematology (current and previous including abstractions from other facilities); face-to-face time discussing  her blood glucose readings/logs, discussing hypoglycemia and hyperglycemia episodes and symptoms, medications doses, her options of short and long term treatment based on the latest standards of care / guidelines;  discussion about incorporating lifestyle medicine;  and documenting the encounter. Risk reduction counseling performed per USPSTF guidelines to reduce obesity and cardiovascular risk factors.     Please refer to Patient Instructions for Blood Glucose Monitoring and Insulin/Medications Dosing Guide"  in media tab for additional information. Please  also refer to " Patient Self Inventory" in the Media  tab for reviewed elements of pertinent patient history.  Alisha Church participated in the discussions, expressed understanding, and voiced agreement with the above plans.  All questions were answered to her satisfaction. she is encouraged to contact clinic should she have any questions or concerns prior to her return visit.     Follow up plan: - Return in about 3 months (around 08/12/2022) for Diabetes F/U with A1c in office, No previsit labs, Bring meter and logs.   Ronny Bacon, [facility] [address] [place], [place] [address] Phone:  [phone] Fax: [phone]  05/13/2022, 3:38 PM

## 2022-05-14 ENCOUNTER — Encounter: Payer: Self-pay | Admitting: Radiology

## 2022-05-25 ENCOUNTER — Telehealth: Payer: Self-pay | Admitting: Radiology

## 2022-05-25 NOTE — Telephone Encounter (Signed)
Patient no show for her April 4 ultrasound appointment. Left message on April 8 & 12 and mailed letter on April 18 for patient to call and reschedule missed appointment. Patient has not rescheduled.

## 2022-06-01 NOTE — Telephone Encounter (Signed)
PUS ordered by JC on 03/18/22 for menometrorrhagia.   Call placed to patient, Left message to call Noreene Larsson, RN at [facility], [phone], option 5.  MyChart message to patient.

## 2022-06-18 ENCOUNTER — Other Ambulatory Visit: Payer: Self-pay | Admitting: Oncology

## 2022-06-18 DIAGNOSIS — D500 Iron deficiency anemia secondary to blood loss (chronic): Secondary | ICD-10-CM

## 2022-06-18 NOTE — Progress Notes (Unsigned)
[facility] Cancer Follow up Visit:  Patient Care Team: Iona Hansen, NP as PCP - General (Nurse Practitioner) Loni Muse, MD as Consulting Physician (Hematology) Tanda Rockers, NP as Nurse Practitioner (Radiology)  CHIEF COMPLAINTS/PURPOSE OF CONSULTATION:  HISTORY OF PRESENTING ILLNESS: Alisha Church 22 y.o. female is here because of anemia  Medical history notable for diabetes mellitus type 1  December 04, 2021: WBC 8.3 hemoglobin 9.5 MCV 68 platelet count 288; 62 segs 28 lymphs 7 monos 3 eos 1 basophil.  Ferritin 6 CMP notable for glucose of 248 sodium 133  January 30 2022:  [facility] Hematology Consult  Patient is G0 P0.  Menopause not reached.  Menses occur every 4 to 5 weeks.  Last 3 days.  Bleeding is heavy especially the first two days.  Does not have bleeding between periods.    Passes clots during her menses.   Patient has/does not have history of uterine fibroids/uterine abnormalities.  Has not been placed on medications to regulate her menses.  Has taken oral iron which she takes intermittently due to constipation.  Has not IV iron nor required PRBC's in the past.  Has a normal diet.  No hematochezia, melena, hemoptysis, hematuria.  No history of intra-articular or soft tissue bleeding.  No history of abnormal bleeding in family members Patient has symptoms of fatigue, pallor,  DOE, decreased performance status.  Has ice pica, and cravings to eat dirt.  Social:  Product manager.  Tobacco none.  EtOH rare  [facility] Mother alive 69 well Father alive 97 sickle cell trait Sister alive 60 well  WBC 7.6 hemoglobin 10.2 MCV 67 platelet count 350; 58 segs 33 lymphs 6 monos 2 eos 1 basophil Reticulocyte count 1.4%.  Hemoglobin electrophoresis demonstrated hemoglobin AS [place] test negative haptoglobin 137 Factor VIII 171 von Willebrand factor antigen 140 ristocetin cofactor 63 INR 1.0 PTT 32 Ferritin 7 folate 11.9 B12 255  February 06, 2022 Feraheme  510 mg      February 27 2022:  .  Reviewed results of labs with patient.  Sugars have been good with more lows recently in part due to decreased eating.  No highs.  Fatigued but doing work, Interior and spatial designer.    Hemoglobin 10.6 MCV 72  March 04 2022:  Feraheme 510 mg   Jun 19 2022:  Scheduled follow up for management of anemia.  Feels better since last visit.  Not as fatigued and able to work out.  Hgb A1c improved.  Last took iron sulfate (65) about 2 weeks ago.  Discussed low dose oral iron.    Hgb 11.8  Review of Systems - Oncology  MEDICAL HISTORY: Past Medical History:  Diagnosis Date   Acne    Dehydration    Diabetes mellitus    Diabetes mellitus without complication (HCC)    Phreesia 01/10/2020   Ketonuria    Type 1 diabetes mellitus not at goal [facility])     SURGICAL HISTORY: Past Surgical History:  Procedure Laterality Date   PILONIDAL CYST / SINUS EXCISION      SOCIAL HISTORY: Social History   Socioeconomic History   Marital status: Single    Spouse name: Not on file   Number of children: Not on file   Years of education: Not on file   Highest education level: Not on file  Occupational History   Not on file  Tobacco Use   Smoking status: Never   Smokeless tobacco: Never  Substance and Sexual Activity  Alcohol use: No   Drug use: No   Sexual activity: Not Currently    Partners: Male    Birth control/protection: Abstinence    Comment: No hx of STIs  Other Topics Concern   Not on file  Social History Narrative   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    Social Determinants of Health   Financial Resource Strain: Not on file  Food Insecurity: Not on file  Transportation Needs: Not on file  Physical Activity: Not on file  Stress: Not on file  Social Connections: Not on file  Intimate Partner Violence: Not on file    FAMILY HISTORY Family History  Problem Relation Age of Onset   Thyroid disease Mother     Diabetes Maternal Aunt    Heart disease Maternal Aunt    Hypothyroidism Maternal Grandmother    Thyroid disease Maternal Grandmother    Heart disease Maternal Grandmother    Diabetes Cousin     ALLERGIES:  has No Known Allergies.  MEDICATIONS:  Current Outpatient Medications  Medication Sig Dispense Refill   benzoyl peroxide (CERAVE ACNE FOAMING CREAM) 4 % external liquid Apply topically at bedtime.     clindamycin (CLEOCIN T) 1 % lotion Apply 1 Application topically 2 (two) times daily.     Continuous Blood Gluc Sensor (DEXCOM G7 SENSOR) MISC Inject 1 Application into the skin as directed. Change sensor every 10 days as directed. (Patient not taking: Reported on 05/13/2022) 6 each 3   ferrous sulfate 324 (65 Fe) MG TBEC Take one tablet or capsule daily. 30 tablet 5   insulin aspart (NOVOLOG) 100 UNIT/ML injection Use with Tandem insulin pump for TDD around 50 units. 40 mL 3   Multiple Vitamin (MULTIVITAMIN WITH MINERALS) TABS tablet Take 1 tablet by mouth daily.     tacrolimus (PROTOPIC) 0.1 % ointment Apply 1 Application topically 2 (two) times daily.     tretinoin (RETIN-A) 0.025 % cream Apply topically at bedtime.     No current facility-administered medications for this visit.    PHYSICAL EXAMINATION:  ECOG PERFORMANCE STATUS: 0 - Asymptomatic   There were no vitals filed for this visit.   There were no vitals filed for this visit.    Physical Exam Vitals and nursing note reviewed.  Constitutional:      General: She is not in acute distress.    Appearance: Normal appearance. She is not ill-appearing, toxic-appearing or diaphoretic.     Comments: Here alone.  Looks well  HENT:     Head: Normocephalic and atraumatic.     Right Ear: External ear normal.     Left Ear: External ear normal.     Nose: Nose normal. No congestion or rhinorrhea.  Eyes:     General: No scleral icterus.    Extraocular Movements: Extraocular movements intact.     Conjunctiva/sclera:  Conjunctivae normal.     Pupils: Pupils are equal, round, and reactive to light.  Cardiovascular:     Rate and Rhythm: Normal rate and regular rhythm.     Heart sounds: No murmur heard.    No friction rub. No gallop.  Pulmonary:     Effort: Pulmonary effort is normal. No respiratory distress.     Breath sounds: Normal breath sounds. No stridor. No wheezing or rhonchi.  Abdominal:     General: Bowel sounds are normal.     Palpations: Abdomen is soft.     Tenderness: There is  no abdominal tenderness. There is no guarding or rebound.  Musculoskeletal:        General: No swelling, tenderness or deformity.     Cervical back: Normal range of motion and neck supple. No rigidity or tenderness.  Lymphadenopathy:     Head:     Right side of head: No submental, submandibular, tonsillar, preauricular, posterior auricular or occipital adenopathy.     Left side of head: No submental, submandibular, tonsillar, preauricular, posterior auricular or occipital adenopathy.     Cervical: No cervical adenopathy.     Right cervical: No superficial, deep or posterior cervical adenopathy.    Left cervical: No superficial, deep or posterior cervical adenopathy.     Upper Body:     Right upper body: No supraclavicular, axillary, pectoral or epitrochlear adenopathy.     Left upper body: No supraclavicular, axillary, pectoral or epitrochlear adenopathy.  Skin:    General: Skin is warm.     Coloration: Skin is not jaundiced or pale.     Findings: No bruising.  Neurological:     General: No focal deficit present.     Mental Status: She is alert and oriented to person, place, and time.     Cranial Nerves: No cranial nerve deficit.     Gait: Gait normal.  Psychiatric:        Mood and Affect: Mood normal.        Behavior: Behavior normal.        Thought Content: Thought content normal.        Judgment: Judgment normal.      LABORATORY DATA: I have personally reviewed the data as listed:  No visits with  results within 1 Month(s) from this visit.  Latest known visit with results is:  Office Visit on 05/13/2022  Component Date Value Ref Range Status   Hemoglobin A1C 05/13/2022 8.0 (A)  4.0 - 5.6 % Final   Microalbumin Ur, POC 05/13/2022 10 mg/L  mg/L Final   Creatinine, POC 05/13/2022 50 mg/dL  mg/dL Final   Albumin/Creatinine Ratio, Urine, P* 05/13/2022 30-300 mg/G   Final    RADIOGRAPHIC STUDIES: I have personally reviewed the radiological images as listed and agree with the findings in the report  No results found.  ASSESSMENT/PLAN  22 y.o. female is here because of anemia.  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   January 30 2022:  PLT count, PT, PTT, Fibrinogen, von Willebrand screen normal.  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  Carriers usually have none of the symptoms of sickle cell anemia or other sickle cell diseases. However, knowledge of sickle cell trait is important in many settings, such as preconception counseling and evaluation of rare complications. Counselled patient about test results including as pertained to possible reproductive consequences     Cancer Staging  No matching staging information was found for the patient.   No problem-specific Assessment & Plan notes found for this encounter.   No orders of the defined types were placed in this encounter.   33  minutes was spent in patient care.  This included time spent preparing to  see the patient (e.g., review of tests), obtaining and/or reviewing separately obtained history, counseling and educating the patient;  documenting clinical information in the electronic or other health record, independently interpreting results and communicating results to the patient as well as coordination of care.       All questions were answered. The patient knows to call the clinic with any problems, questions or concerns.  This note was electronically signed.    Loni Muse, MD  06/18/2022 9:29 AM

## 2022-06-19 ENCOUNTER — Other Ambulatory Visit: Payer: Self-pay

## 2022-06-19 ENCOUNTER — Inpatient Hospital Stay: Payer: BC Managed Care – PPO | Attending: Oncology

## 2022-06-19 ENCOUNTER — Inpatient Hospital Stay: Payer: BC Managed Care – PPO | Admitting: Oncology

## 2022-06-19 VITALS — BP 116/73 | HR 74 | Temp 98.3°F | Resp 18 | Ht 61.0 in | Wt 132.0 lb

## 2022-06-19 DIAGNOSIS — N920 Excessive and frequent menstruation with regular cycle: Secondary | ICD-10-CM | POA: Diagnosis present

## 2022-06-19 DIAGNOSIS — E538 Deficiency of other specified B group vitamins: Secondary | ICD-10-CM | POA: Diagnosis not present

## 2022-06-19 DIAGNOSIS — D500 Iron deficiency anemia secondary to blood loss (chronic): Secondary | ICD-10-CM

## 2022-06-19 DIAGNOSIS — N938 Other specified abnormal uterine and vaginal bleeding: Secondary | ICD-10-CM

## 2022-06-19 DIAGNOSIS — E109 Type 1 diabetes mellitus without complications: Secondary | ICD-10-CM | POA: Insufficient documentation

## 2022-06-19 DIAGNOSIS — Z79621 Long term (current) use of calcineurin inhibitor: Secondary | ICD-10-CM | POA: Insufficient documentation

## 2022-06-19 DIAGNOSIS — Z794 Long term (current) use of insulin: Secondary | ICD-10-CM | POA: Diagnosis not present

## 2022-06-19 DIAGNOSIS — D573 Sickle-cell trait: Secondary | ICD-10-CM | POA: Diagnosis not present

## 2022-06-19 LAB — CBC (CANCER CENTER ONLY)
HCT: 35.2 % — ABNORMAL LOW (ref 36.0–46.0)
Hemoglobin: 11.8 g/dL — ABNORMAL LOW (ref 12.0–15.0)
MCH: 26.1 pg (ref 26.0–34.0)
MCHC: 33.5 g/dL (ref 30.0–36.0)
MCV: 77.9 fL — ABNORMAL LOW (ref 80.0–100.0)
Platelet Count: 248 10*3/uL (ref 150–400)
RBC: 4.52 MIL/uL (ref 3.87–5.11)
RDW: 13.1 % (ref 11.5–15.5)
WBC Count: 5.8 10*3/uL (ref 4.0–10.5)
nRBC: 0 % (ref 0.0–0.2)

## 2022-06-19 LAB — CMP (CANCER CENTER ONLY)
ALT: 7 U/L (ref 0–44)
AST: 11 U/L — ABNORMAL LOW (ref 15–41)
Albumin: 4.3 g/dL (ref 3.5–5.0)
Alkaline Phosphatase: 94 U/L (ref 38–126)
Anion gap: 7 (ref 5–15)
BUN: 12 mg/dL (ref 6–20)
CO2: 26 mmol/L (ref 22–32)
Calcium: 9.2 mg/dL (ref 8.9–10.3)
Chloride: 105 mmol/L (ref 98–111)
Creatinine: 0.65 mg/dL (ref 0.44–1.00)
GFR, Estimated: >60 mL/min (ref >60)
Glucose, Bld: 174 mg/dL — ABNORMAL HIGH (ref 70–99)
Potassium: 3.9 mmol/L (ref 3.5–5.1)
Sodium: 138 mmol/L (ref 135–145)
Total Bilirubin: 0.7 mg/dL (ref 0.3–1.2)
Total Protein: 7.3 g/dL (ref 6.5–8.1)

## 2022-06-19 LAB — FERRITIN: Ferritin: 74 ng/mL (ref 11–307)

## 2022-06-19 LAB — FOLATE: Folate: 12.1 ng/mL (ref >5.9)

## 2022-06-19 LAB — IRON AND IRON BINDING CAPACITY (CC-WL,HP ONLY)
Iron: 85 ug/dL (ref 28–170)
Saturation Ratios: 31 % (ref 10.4–31.8)
TIBC: 276 ug/dL (ref 250–450)
UIBC: 191 ug/dL (ref 148–442)

## 2022-06-19 LAB — VITAMIN B12: Vitamin B-12: 237 pg/mL (ref 180–914)

## 2022-06-19 NOTE — Patient Instructions (Signed)
Please begin [employer]'s multivitamin with iron daily to every other day

## 2022-06-23 ENCOUNTER — Telehealth: Payer: Self-pay

## 2022-06-23 ENCOUNTER — Encounter: Payer: Self-pay | Admitting: Oncology

## 2022-06-23 ENCOUNTER — Telehealth: Payer: Self-pay | Admitting: Oncology

## 2022-06-23 DIAGNOSIS — E538 Deficiency of other specified B group vitamins: Secondary | ICD-10-CM | POA: Insufficient documentation

## 2022-06-23 NOTE — Telephone Encounter (Addendum)
Called patient to relay message below as per Dr. Angelene Giovanni, Patient voiced full understanding.   ----- Message from Loni Muse, MD sent at 06/23/2022  8:36 AM EDT ----- Please have patient begin Vitamin B12 1000 micrograms daily, sublingual Thanks ----- Message ----- From: Leory Plowman, [facility] [place] Sent: 06/19/2022   9:40 AM EDT To: Loni Muse, MD

## 2022-06-23 NOTE — Telephone Encounter (Signed)
Left patient a vm regarding upcoming appointment  

## 2022-06-24 NOTE — Telephone Encounter (Signed)
No response from patient.   Letter pended. Copy to Jami to review and sign.

## 2022-06-25 NOTE — Telephone Encounter (Signed)
Letter reviewed and signed by [facility].   Mailed to address on file.   PUS order canceled.   Encounter closed.

## 2022-06-30 NOTE — Telephone Encounter (Signed)
To Alisha Church to return call to provide payment options.

## 2022-06-30 NOTE — Telephone Encounter (Signed)
Patient is scheduled for PUS on 07/23/22.  See account note. Patient will need to return for consult, if needed.   Routing to [employer].   CC: Cecil Cranker.

## 2022-07-02 ENCOUNTER — Encounter: Payer: Self-pay | Admitting: Nurse Practitioner

## 2022-07-23 ENCOUNTER — Other Ambulatory Visit: Payer: BC Managed Care – PPO

## 2022-07-23 ENCOUNTER — Other Ambulatory Visit: Payer: Self-pay

## 2022-07-23 DIAGNOSIS — N921 Excessive and frequent menstruation with irregular cycle: Secondary | ICD-10-CM

## 2022-07-24 ENCOUNTER — Telehealth: Payer: Self-pay | Admitting: *Deleted

## 2022-07-24 NOTE — Telephone Encounter (Signed)
Left message to call Sharde Gover, RN at [facility], [phone], option 5.  

## 2022-07-24 NOTE — Telephone Encounter (Signed)
-----   Message from Eliezer Bottom, [place] sent at 07/24/2022  7:54 AM EDT -----  ----- Message ----- From: Lillia Carmel, CMA Sent: 07/23/2022   2:22 PM EDT To: [facility] Triage  Patient cancelled ultrasound appt stating "I do not feel like its necessary".

## 2022-07-31 ENCOUNTER — Encounter (INDEPENDENT_AMBULATORY_CARE_PROVIDER_SITE_OTHER): Payer: Self-pay

## 2022-08-13 ENCOUNTER — Encounter (INDEPENDENT_AMBULATORY_CARE_PROVIDER_SITE_OTHER): Payer: Self-pay

## 2022-08-19 ENCOUNTER — Ambulatory Visit: Payer: BC Managed Care – PPO | Admitting: Nurse Practitioner

## 2022-08-19 ENCOUNTER — Encounter: Payer: Self-pay | Admitting: Nurse Practitioner

## 2022-08-19 VITALS — BP 113/72 | HR 89 | Ht 61.0 in | Wt 136.6 lb

## 2022-08-19 DIAGNOSIS — E109 Type 1 diabetes mellitus without complications: Secondary | ICD-10-CM | POA: Diagnosis not present

## 2022-08-19 DIAGNOSIS — Z794 Long term (current) use of insulin: Secondary | ICD-10-CM

## 2022-08-19 LAB — POCT GLYCOSYLATED HEMOGLOBIN (HGB A1C): Hemoglobin A1C: 7.6 % — AB (ref 4.0–5.6)

## 2022-08-19 MED ORDER — OMNIPOD 5 DEXG7G6 PODS GEN 5 MISC: 11 refills | Status: DC

## 2022-08-19 MED ORDER — OMNIPOD 5 DEXG7G6 INTRO GEN 5 KIT: PACK | 0 refills | Status: DC

## 2022-08-19 NOTE — Telephone Encounter (Signed)
PUS ordered 03/18/22 by Clearnce Hasten, NP for Menometrorrhagia.   See patient message below. Called to further assess and answer any additional questions.   Left message to call Noreene Larsson, RN at [place], [phone], option 5.

## 2022-08-19 NOTE — Progress Notes (Signed)
Endocrinology Follow Up Note       08/19/2022, 4:47 PM   Subjective:    Patient ID: Alisha Church, female    DOB: 10-Apr-2000.  Alisha Church is being seen in follow up after being seen in consultation for management of currently uncontrolled symptomatic diabetes requested by  Iona Hansen, NP.   Past Medical History:  Diagnosis Date   Acne    Dehydration    Diabetes mellitus    Diabetes mellitus without complication (HCC)    Phreesia 01/10/2020   Ketonuria    Type 1 diabetes mellitus not at goal [facility])     Past Surgical History:  Procedure Laterality Date   PILONIDAL CYST / SINUS EXCISION      Social History   Socioeconomic History   Marital status: Single    Spouse name: Not on file   Number of children: Not on file   Years of education: Not on file   Highest education level: Not on file  Occupational History   Not on file  Tobacco Use   Smoking status: Never   Smokeless tobacco: Never  Substance and Sexual Activity   Alcohol use: No   Drug use: No   Sexual activity: Not Currently    Partners: Male    Birth control/protection: Abstinence    Comment: No hx of STIs  Other Topics Concern   Not on file  Social History Narrative   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    Social Determinants of Health   Financial Resource Strain: Not on file  Food Insecurity: Not on file  Transportation Needs: Not on file  Physical Activity: Not on file  Stress: Not on file  Social Connections: Not on file    Family History  Problem Relation Age of Onset   Thyroid disease Mother    Diabetes Maternal Aunt    Heart disease Maternal Aunt    Hypothyroidism Maternal Grandmother    Thyroid disease Maternal Grandmother    Heart disease Maternal Grandmother    Diabetes Cousin     Outpatient Encounter Medications as of 08/19/2022  Medication Sig   benzoyl  peroxide (CERAVE ACNE FOAMING CREAM) 4 % external liquid Apply topically at bedtime.   clindamycin (CLEOCIN T) 1 % lotion Apply 1 Application topically 2 (two) times daily.   Continuous Blood Gluc Sensor (DEXCOM G7 SENSOR) MISC Inject 1 Application into the skin as directed. Change sensor every 10 days as directed.   Cyanocobalamin (VITAMIN B 12 PO) Take 2,500 mcg by mouth daily.   ferrous sulfate 324 (65 Fe) MG TBEC Take one tablet or capsule daily.   hydrOXYzine (ATARAX) 10 MG tablet Take 10 mg by mouth as needed.   insulin aspart (NOVOLOG) 100 UNIT/ML injection Use with Tandem insulin pump for TDD around 50 units.   Insulin Disposable Pump (OMNIPOD 5 G6 INTRO, GEN 5,) KIT Change pod every 48-72 hours   Insulin Disposable Pump (OMNIPOD 5 G6 PODS, GEN 5,) MISC Change pod every 48-72 hours   Multiple Vitamin (MULTIVITAMIN WITH MINERALS) TABS tablet Take 1 tablet by mouth daily.   PARoxetine (PAXIL) 10 MG tablet Take  10 mg by mouth daily.   tacrolimus (PROTOPIC) 0.1 % ointment Apply 1 Application topically 2 (two) times daily.   tretinoin (RETIN-A) 0.025 % cream Apply topically at bedtime.   No facility-administered encounter medications on file as of 08/19/2022.    ALLERGIES: No Known Allergies  VACCINATION STATUS: Immunization History  Administered Date(s) Administered   DTaP 03/03/1999, 05/14/1999, 07/15/1999, 05/17/2000, 04/11/2004   H1N1 01/17/2008   HIB (PRP-OMP) 03/03/1999, 05/14/1999, 07/15/1999, 05/17/2000   HPV 9-valent 07/24/2010, 09/17/2011, 12/01/2012   HPV Quadrivalent 07/24/2010, 09/17/2011, 12/01/2012   Hepatitis A 11/12/2006, 06/13/2007   Hepatitis A, Adult 11/12/2006, 06/13/2007   Hepatitis A, Ped/Adol-2 Dose 11/12/2006, 06/13/2007   Hepatitis B 1998[birth year], 03/12/1999, 07/15/1999   Hepatitis B, PED/ADOLESCENT 08/27/98, 03/12/1999, 07/15/1999   Hpv-Unspecified 07/24/2010, 09/17/2011   IPV 03/12/1999, 05/14/1999, 02/24/2000, 11/23/2000, 04/11/2004   Influenza Nasal  01/17/2008   Influenza Whole 12/09/2011   Influenza,inj,Quad PF,6+ Mos 11/02/2012, 12/24/2014, 02/08/2018   Influenza,inj,quad, With Preservative 02/26/2014   Influenza-Unspecified 01/17/2008, 11/02/2012, 02/26/2014, 12/24/2014, 11/05/2021   MMR 02/24/2000, 04/11/2004   Meningococcal Conjugate 07/24/2010, 05/13/2016, 05/13/2016   PFIZER Comirnaty(Gray Top)Covid-19 Tri-Sucrose Vaccine 10/09/2020, 11/10/2020   Pneumococcal Conjugate-13 03/03/1999, 05/14/1999, 07/15/1999, 02/24/2000   Pneumococcal Polysaccharide-23 03/03/1999, 05/14/1999   Pneumococcal-Unspecified 03/03/1999, 05/14/1999, 07/15/1999, 02/24/2000   Tdap 07/24/2010, 12/04/2021   Varicella 03/03/1999, 05/14/1999, 05/17/2000, 11/12/2006    Diabetes She presents for her follow-up diabetic visit. She has type 1 diabetes mellitus. Onset time: Diagnosed at approx age of 84. Her disease course has been fluctuating. There are no hypoglycemic associated symptoms. There are no diabetic associated symptoms. There are no hypoglycemic complications. There are no diabetic complications. Risk factors for coronary artery disease include diabetes mellitus. Current diabetic treatment includes insulin pump. She is compliant with treatment most of the time. Her weight is stable. She is following a generally healthy diet. Meal planning includes avoidance of concentrated sweets. She has had a previous visit with a dietitian. She participates in exercise intermittently. Her home blood glucose trend is fluctuating dramatically. Her overall blood glucose range is 180-200 mg/dl. (She presents today with her CGM and pump showing drastically fluctuating glycemic profile.  Her POCT A1c today is 7.6% improving from last visit of 8%.   Analysis of her CGM/pump shows TIR 53%, TAR 44%, TBR 3%.  She did fill out PAP form for Dexcom, is waiting on shipment.) An Church inhibitor/angiotensin II receptor blocker is not being taken. She does not see a podiatrist.Eye exam is  current.    Review of systems  Constitutional: + Minimally fluctuating body weight,  current Body mass index is 25.81 kg/m. , no fatigue, no subjective hyperthermia, no subjective hypothermia Eyes: no blurry vision, no xerophthalmia ENT: no sore throat, no nodules palpated in throat, no dysphagia/odynophagia, no hoarseness Cardiovascular: no chest pain, no shortness of breath, no palpitations, no leg swelling Respiratory: no cough, no shortness of breath Gastrointestinal: no nausea/vomiting/diarrhea Musculoskeletal: no muscle/joint aches Skin: no rashes, no hyperemia Neurological: no tremors, no numbness, no tingling, no dizziness Psychiatric: no depression, no anxiety  Objective:     BP 113/72 (BP Location: Left Arm, Patient Position: Sitting, Cuff Size: Normal)   Pulse 89   Ht 5\' 1"  (1.549 m)   Wt 136 lb 9.6 oz (62 kg)   BMI 25.81 kg/m   Wt Readings from Last 3 Encounters:  08/19/22 136 lb 9.6 oz (62 kg)  06/19/22 132 lb (59.9 kg)  05/13/22 132 lb 3.2 oz (60 kg)     BP Readings from  Last 3 Encounters:  08/19/22 113/72  06/19/22 116/73  05/13/22 (!) 100/58     Physical Exam- Limited  Constitutional:  Body mass index is 25.81 kg/m. , not in acute distress, normal state of mind Eyes:  EOMI, no exophthalmos Musculoskeletal: no gross deformities, strength intact in all four extremities, no gross restriction of joint movements Skin:  no rashes, no hyperemia Neurological: no tremor with outstretched hands   Diabetic Foot Exam - Simple   No data filed     CMP ( most recent) CMP     Component Value Date/Time   NA 138 06/19/2022 0922   K 3.9 06/19/2022 0922   CL 105 06/19/2022 0922   CO2 26 06/19/2022 0922   GLUCOSE 174 (H) 06/19/2022 0922   BUN 12 06/19/2022 0922   BUN 11 12/04/2021 0000   CREATININE 0.65 06/19/2022 0922   CREATININE 0.68 03/11/2021 0813   CALCIUM 9.2 06/19/2022 0922   PROT 7.3 06/19/2022 0922   ALBUMIN 4.3 06/19/2022 0922   AST 11 (L)  06/19/2022 0922   ALT 7 06/19/2022 0922   ALKPHOS 94 06/19/2022 0922   BILITOT 0.7 06/19/2022 0922   GFRNONAA >60 06/19/2022 0922   GFRAA NOT CALCULATED 07/19/2010 1509     Diabetic Labs (most recent): Lab Results  Component Value Date   HGBA1C 7.6 (A) 08/19/2022   HGBA1C 8.0 (A) 05/13/2022   HGBA1C 8.6 (A) 01/22/2022   MICROALBUR 10 mg/L 05/13/2022   MICROALBUR 0.4 03/11/2021   MICROALBUR 0.4 09/13/2019     Lipid Panel ( most recent) Lipid Panel     Component Value Date/Time   CHOL 138 03/11/2021 0813   TRIG 55 12/04/2021 0000   HDL 69 03/11/2021 0813   CHOLHDL 2.0 03/11/2021 0813   VLDL 12 04/04/2015 1433   LDLCALC 57 12/04/2021 0000   LDLCALC 59 03/11/2021 0813      Lab Results  Component Value Date   TSH 2.39 09/17/2021   TSH 3.06 03/11/2021   TSH 1.39 09/13/2019   TSH 1.28 10/25/2018   TSH 1.76 06/17/2017   TSH 1.10 04/04/2015   TSH 2.191 01/18/2014   TSH 2.850 10/31/2012   TSH 2.261 12/18/2011   TSH 2.253 10/08/2010   FREET4 0.9 09/17/2021   FREET4 1.0 03/11/2021   FREET4 0.9 09/13/2019   FREET4 1.0 10/25/2018   FREET4 1.2 06/17/2017   FREET4 1.2 04/04/2015   FREET4 1.09 01/18/2014   FREET4 1.07 10/31/2012   FREET4 1.11 12/18/2011   FREET4 1.19 10/08/2010           Assessment & Plan:   1) Type 1 diabetes mellitus without complication (HCC)  She presents today with her CGM and pump showing drastically fluctuating glycemic profile.  Her POCT A1c today is 7.6% improving from last visit of 8%.   Analysis of her CGM/pump shows TIR 53%, TAR 44%, TBR 3%.  She did fill out PAP form for Dexcom, is waiting on shipment.  - Kearia Yin has currently uncontrolled symptomatic type 1 DM since 22 years of age.   -Recent labs reviewed.  - I had a long discussion with her about the progressive nature of diabetes and the pathology behind its complications. -her diabetes is not currently complicated but she remains at a high risk for more acute and  chronic complications which include CAD, CVA, CKD, retinopathy, and neuropathy. These are all discussed in detail with her.  The following Lifestyle Medicine recommendations according to American College of Lifestyle Medicine [facility]) were discussed  and offered to patient and she agrees to start the journey:  A. Whole Foods, Plant-based plate comprising of fruits and vegetables, plant-based proteins, whole-grain carbohydrates was discussed in detail with the patient.   A list for source of those nutrients were also provided to the patient.  Patient will use only water or unsweetened tea for hydration. B.  The need to stay away from risky substances including alcohol, smoking; obtaining 7 to 9 hours of restorative sleep, at least 150 minutes of moderate intensity exercise weekly, the importance of healthy social connections,  and stress reduction techniques were discussed. C.  A full color page of  Calorie density of various food groups per pound showing examples of each food groups was provided to the patient.  - Nutritional counseling repeated at each appointment due to patients tendency to fall back in to old habits.  - The patient admits there is a room for improvement in their diet and drink choices. -  Suggestion is made for the patient to avoid simple carbohydrates from their diet including Cakes, Sweet Desserts / Pastries, Ice Cream, Soda (diet and regular), Sweet Tea, Candies, Chips, Cookies, Sweet Pastries, Store Bought Juices, Alcohol in Excess of 1-2 drinks a day, Artificial Sweeteners, Coffee Creamer, and "Sugar-free" Products. This will help patient to have stable blood glucose profile and potentially avoid unintended weight gain.   - I encouraged the patient to switch to unprocessed or minimally processed complex starch and increased protein intake (animal or plant source), fruits, and vegetables.   - Patient is advised to stick to a routine mealtimes to eat 3 meals a day and avoid  unnecessary snacks (to snack only to correct hypoglycemia).  - I have approached her with the following individualized plan to manage her diabetes and patient agrees:   -I encouraged her to adjust her insulin to carb ratio to 1:8 instead of 1: 3-5.  This may help prevent her postprandial readings from dropping too low.  I also did put in order for Omnipod 5 to [facility].  -she is encouraged to continue monitoring glucose 4 times daily (using her CGM), before meals and before bed.   - she is warned not to take insulin without proper monitoring per orders. - Adjustment parameters are given to her for hypo and hyperglycemia in writing. - she is encouraged to call clinic for blood glucose levels less than 70 or above 300 mg /dl.  -Due to her type 1 diagnosis, she is not a candidate for non-insulin therapies.  - Specific targets for  A1c; LDL, HDL, and Triglycerides were discussed with the patient.  2) Blood Pressure /Hypertension:  her blood pressure is controlled to target without the use of antihypertensive medications.     3) Lipids/Hyperlipidemia:    Review of her recent lipid panel from 12/04/21 showed controlled LDL at 57.  She is not currently on any lipid lowering medications.   4)  Weight/Diet:  her Body mass index is 25.81 kg/m.  -   she is NOT a candidate for weight loss.  Exercise, and detailed carbohydrates information provided  -  detailed on discharge instructions.  5) Chronic Care/Health Maintenance: -she is not on ACEI/ARB or Statin medications and is encouraged to initiate and continue to follow up with Ophthalmology, Dentist, Podiatrist at least yearly or according to recommendations, and advised to stay away from smoking. I have recommended yearly flu vaccine and pneumonia vaccine at least every 5 years; moderate intensity exercise for up to 150 minutes weekly;  and sleep for at least 7 hours a day.  - she is advised to maintain close follow up with Iona Hansen, NP for  primary care needs, as well as her other providers for optimal and coordinated care.      I spent  41  minutes in the care of the patient today including review of labs from CMP, Lipids, Thyroid Function, Hematology (current and previous including abstractions from other facilities); face-to-face time discussing  her blood glucose readings/logs, discussing hypoglycemia and hyperglycemia episodes and symptoms, medications doses, her options of short and long term treatment based on the latest standards of care / guidelines;  discussion about incorporating lifestyle medicine;  and documenting the encounter. Risk reduction counseling performed per USPSTF guidelines to reduce obesity and cardiovascular risk factors.     Please refer to Patient Instructions for Blood Glucose Monitoring and Insulin/Medications Dosing Guide"  in media tab for additional information. Please  also refer to " Patient Self Inventory" in the Media  tab for reviewed elements of pertinent patient history.  Alisha Church participated in the discussions, expressed understanding, and voiced agreement with the above plans.  All questions were answered to her satisfaction. she is encouraged to contact clinic should she have any questions or concerns prior to her return visit.     Follow up plan: - Return in about 4 months (around 12/20/2022) for Diabetes F/U with A1c in office, No previsit labs, Bring meter and logs.  Ronny Bacon, [facility] [address] [place], [place] [address] Phone: [phone] Fax: [phone]  08/19/2022, 4:47 PM

## 2022-08-20 MED ORDER — OMNIPOD 5 DEXG7G6 PODS GEN 5 MISC
11 refills | Status: DC
  Filled 2023-04-07: qty 10, 30d supply, fill #0
  Filled 2023-04-30 – 2023-05-03 (×2): qty 10, 30d supply, fill #1
  Filled 2023-06-01: qty 10, 30d supply, fill #2
  Filled 2023-07-02: qty 10, 30d supply, fill #3
  Filled 2023-08-01: qty 10, 30d supply, fill #4
  Filled 2023-08-07: qty 10, 20d supply, fill #4

## 2022-08-20 MED ORDER — OMNIPOD 5 DEXG7G6 INTRO GEN 5 KIT: PACK | 0 refills | Status: DC

## 2022-08-20 NOTE — Addendum Note (Signed)
Addended by: Dani Gobble on: 08/20/2022 12:27 PM   Modules accepted: Orders

## 2022-08-21 NOTE — Telephone Encounter (Signed)
No response from patient, letter pended and routed to [facility] for review.

## 2022-09-04 NOTE — Telephone Encounter (Signed)
Ok to send

## 2022-09-04 NOTE — Telephone Encounter (Signed)
Order discontinued for PUS.  Letter sent via MyChart and printed to be mailed by standard mail.   Encounter closed.

## 2022-09-04 NOTE — Telephone Encounter (Signed)
Jami -please review letter. Ok to proceed?

## 2022-09-08 NOTE — Telephone Encounter (Signed)
Letter signed by JC and mailed.

## 2022-09-18 ENCOUNTER — Other Ambulatory Visit: Payer: Self-pay | Admitting: Nurse Practitioner

## 2022-10-01 ENCOUNTER — Other Ambulatory Visit: Payer: Self-pay | Admitting: Nurse Practitioner

## 2022-10-14 ENCOUNTER — Telehealth: Payer: Self-pay | Admitting: Physician Assistant

## 2022-10-21 ENCOUNTER — Encounter: Payer: Self-pay | Admitting: Oncology

## 2022-10-22 ENCOUNTER — Other Ambulatory Visit: Payer: Self-pay | Admitting: Physician Assistant

## 2022-10-22 DIAGNOSIS — D500 Iron deficiency anemia secondary to blood loss (chronic): Secondary | ICD-10-CM

## 2022-10-22 DIAGNOSIS — E538 Deficiency of other specified B group vitamins: Secondary | ICD-10-CM

## 2022-10-23 ENCOUNTER — Inpatient Hospital Stay: Payer: BC Managed Care – PPO | Attending: Oncology

## 2022-10-23 ENCOUNTER — Inpatient Hospital Stay: Payer: BC Managed Care – PPO | Admitting: Physician Assistant

## 2022-10-23 ENCOUNTER — Ambulatory Visit: Payer: BC Managed Care – PPO | Admitting: Oncology

## 2022-10-23 ENCOUNTER — Other Ambulatory Visit: Payer: BC Managed Care – PPO

## 2022-10-23 VITALS — BP 116/76 | HR 77 | Temp 97.9°F | Resp 16 | Wt 136.2 lb

## 2022-10-23 DIAGNOSIS — D500 Iron deficiency anemia secondary to blood loss (chronic): Secondary | ICD-10-CM | POA: Diagnosis not present

## 2022-10-23 DIAGNOSIS — N920 Excessive and frequent menstruation with regular cycle: Secondary | ICD-10-CM | POA: Diagnosis not present

## 2022-10-23 DIAGNOSIS — I951 Orthostatic hypotension: Secondary | ICD-10-CM | POA: Diagnosis not present

## 2022-10-23 DIAGNOSIS — D509 Iron deficiency anemia, unspecified: Secondary | ICD-10-CM | POA: Diagnosis present

## 2022-10-23 DIAGNOSIS — E538 Deficiency of other specified B group vitamins: Secondary | ICD-10-CM

## 2022-10-23 LAB — CBC WITH DIFFERENTIAL (CANCER CENTER ONLY)
Abs Immature Granulocytes: 0 10*3/uL (ref 0.00–0.07)
Basophils Absolute: 0 10*3/uL (ref 0.0–0.1)
Basophils Relative: 0 %
Eosinophils Absolute: 0.1 10*3/uL (ref 0.0–0.5)
Eosinophils Relative: 2 %
HCT: 35 % — ABNORMAL LOW (ref 36.0–46.0)
Hemoglobin: 11.5 g/dL — ABNORMAL LOW (ref 12.0–15.0)
Immature Granulocytes: 0 %
Lymphocytes Relative: 38 %
Lymphs Abs: 1.9 10*3/uL (ref 0.7–4.0)
MCH: 25.3 pg — ABNORMAL LOW (ref 26.0–34.0)
MCHC: 32.9 g/dL (ref 30.0–36.0)
MCV: 77.1 fL — ABNORMAL LOW (ref 80.0–100.0)
Monocytes Absolute: 0.3 10*3/uL (ref 0.1–1.0)
Monocytes Relative: 7 %
Neutro Abs: 2.8 10*3/uL (ref 1.7–7.7)
Neutrophils Relative %: 53 %
Platelet Count: 259 10*3/uL (ref 150–400)
RBC: 4.54 MIL/uL (ref 3.87–5.11)
RDW: 13.2 % (ref 11.5–15.5)
WBC Count: 5.1 10*3/uL (ref 4.0–10.5)
nRBC: 0 % (ref 0.0–0.2)

## 2022-10-23 LAB — VITAMIN B12: Vitamin B-12: 525 pg/mL (ref 180–914)

## 2022-10-23 LAB — FERRITIN: Ferritin: 36 ng/mL (ref 11–307)

## 2022-10-23 LAB — IRON AND IRON BINDING CAPACITY (CC-WL,HP ONLY)
Iron: 81 ug/dL (ref 28–170)
Saturation Ratios: 25 % (ref 10.4–31.8)
TIBC: 328 ug/dL (ref 250–450)
UIBC: 247 ug/dL (ref 148–442)

## 2022-10-23 MED ORDER — FERROUS SULFATE 325 (65 FE) MG PO TABS: 325.0000 mg | ORAL_TABLET | Freq: Every day | ORAL | 6 refills | Status: AC

## 2022-10-23 NOTE — Progress Notes (Signed)
[facility] [phone]   [phone]  PROGRESS NOTE  Patient Care Team: Iona Hansen, NP as PCP - General (Nurse Practitioner) Loni Muse, MD as Consulting Physician (Hematology) Tanda Rockers, NP as Nurse Practitioner (Radiology)  CHIEF COMPLAINTS/PURPOSE OF CONSULTATION:  Iron deficiency anemia B12 deficiency  HISTORY OF PRESENTING ILLNESS:   Discussed the use of AI scribe software for clinical note transcription with the patient, who gave verbal consent to proceed.  Alisha Church 22 y.o. female returns for a follow up for iron and B12 deficiency anemia. She was last seen by Dr. Angelene Giovanni on 06/19/2022.  In the interim, she has been compliant with daily B12 and iron supplements. She reports feeling 'pretty good overall' with good energy levels and no dietary restrictions. She no longer feels groggy and is able to study and work effectively. She denies any nausea, vomiting, or bowel irregularities.  Her menstrual cycle, previously irregular, has become more regular, occurring once a month and lasting for four days, with the first three days being heavy. She is currently under the care of an OB GYN who is monitoring her menstrual cycle but has not yet started her on birth control.  She has noticed some dizziness, particularly when getting up quickly from a lying position, but denies any fainting or passing out episodes. She does not crave ice. She experienced a severe nosebleed last week, which lasted for about five minutes and ended with a large clot. This was the first time she had experienced such a severe nosebleed. She has recently started on paroxetine, which she has been self-titrating due to perceived side effects.      MEDICAL HISTORY:  Past Medical History:  Diagnosis Date   Acne    Dehydration    Diabetes mellitus    Diabetes mellitus without complication (HCC)    Phreesia 01/10/2020   Ketonuria    Type 1 diabetes  mellitus not at goal [facility])     SURGICAL HISTORY: Past Surgical History:  Procedure Laterality Date   PILONIDAL CYST / SINUS EXCISION      SOCIAL HISTORY: Social History   Socioeconomic History   Marital status: Single    Spouse name: Not on file   Number of children: Not on file   Years of education: Not on file   Highest education level: Not on file  Occupational History   Not on file  Tobacco Use   Smoking status: Never   Smokeless tobacco: Never  Substance and Sexual Activity   Alcohol use: No   Drug use: No   Sexual activity: Not Currently    Partners: Male    Birth control/protection: Abstinence    Comment: No hx of STIs  Other Topics Concern   Not on file  Social History Narrative   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    Social Determinants of Health   Financial Resource Strain: Not on file  Food Insecurity: Low Risk  (10/06/2022)   Received from [facility]   Hunger Vital Sign    Worried About Running Out of Food in the Last Year: Never true    Ran Out of Food in the Last Year: Never true  Transportation Needs: No Transportation Needs (10/06/2022)   Received from [facility]    In the past 12 months, has lack of reliable transportation kept you from medical appointments, meetings, work or from getting things needed for  daily living? : No  Physical Activity: Not on file  Stress: Not on file  Social Connections: Not on file  Intimate Partner Violence: Not on file    FAMILY HISTORY: Family History  Problem Relation Age of Onset   Thyroid disease Mother    Diabetes Maternal Aunt    Heart disease Maternal Aunt    Hypothyroidism Maternal Grandmother    Thyroid disease Maternal Grandmother    Heart disease Maternal Grandmother    Diabetes Cousin     ALLERGIES:  has No Known Allergies.  MEDICATIONS:  Current Outpatient Medications  Medication Sig Dispense Refill   benzoyl peroxide (CERAVE  ACNE FOAMING CREAM) 4 % external liquid Apply topically at bedtime.     clindamycin (CLEOCIN T) 1 % lotion Apply 1 Application topically 2 (two) times daily.     Continuous Blood Gluc Sensor (DEXCOM G7 SENSOR) MISC Inject 1 Application into the skin as directed. Change sensor every 10 days as directed. 6 each 3   Cyanocobalamin (VITAMIN B 12 PO) Take 2,500 mcg by mouth daily.     ferrous sulfate 325 (65 FE) MG EC tablet Take 1 tablet (325 mg total) by mouth daily with breakfast. 30 tablet 6   hydrOXYzine (ATARAX) 10 MG tablet Take 10 mg by mouth as needed.     insulin aspart (NOVOLOG) 100 UNIT/ML injection USE WITH TANDEM INSULIN PUMP FOR TOTAL DAILY DOSE AROUND 50 UNITS. 40 mL 1   Insulin Disposable Pump (OMNIPOD 5 G6 INTRO, GEN 5,) KIT Change pod every 48-72 hours 1 kit 0   Insulin Disposable Pump (OMNIPOD 5 G6 PODS, GEN 5,) MISC Change pod every 48-72 hours 10 each 11   Multiple Vitamin (MULTIVITAMIN WITH MINERALS) TABS tablet Take 1 tablet by mouth daily.     PARoxetine (PAXIL) 10 MG tablet Take 10 mg by mouth daily.     Pediatric Multivitamins-Iron (FLINTSTONES PLUS EXTRA IRON PO) Take by mouth daily.     tacrolimus (PROTOPIC) 0.1 % ointment Apply 1 Application topically 2 (two) times daily.     tretinoin (RETIN-A) 0.025 % cream Apply topically at bedtime.     No current facility-administered medications for this visit.    REVIEW OF SYSTEMS:   Constitutional: ( - ) fevers, ( - )  chills , ( - ) night sweats Eyes: ( - ) blurriness of vision, ( - ) double vision, ( - ) watery eyes Ears, nose, mouth, throat, and face: ( - ) mucositis, ( - ) sore throat Respiratory: ( - ) cough, ( - ) dyspnea, ( - ) wheezes Cardiovascular: ( - ) palpitation, ( - ) chest discomfort, ( - ) lower extremity swelling Gastrointestinal:  ( - ) nausea, ( - ) heartburn, ( - ) change in bowel habits Skin: ( - ) abnormal skin rashes Lymphatics: ( - ) new lymphadenopathy, ( - ) easy bruising Neurological: ( - )  numbness, ( - ) tingling, ( - ) new weaknesses Behavioral/Psych: ( - ) mood change, ( - ) new changes  All other systems were reviewed with the patient and are negative.  PHYSICAL EXAMINATION: ECOG PERFORMANCE STATUS: 0 - Asymptomatic  Vitals:   10/23/22 1107  BP: 116/76  Pulse: 77  Resp: 16  Temp: 97.9 F (36.6 C)  SpO2: 100%   Filed Weights   10/23/22 1107  Weight: 136 lb 3.2 oz (61.8 kg)    GENERAL: well appearing female in NAD  SKIN: skin color, texture, turgor are normal, no rashes  or significant lesions EYES: conjunctiva are pink and non-injected, sclera clear LUNGS: clear to auscultation and percussion with normal breathing effort HEART: regular rate & rhythm and no murmurs and no lower extremity edema Musculoskeletal: no cyanosis of digits and no clubbing  PSYCH: alert & oriented x 3, fluent speech NEURO: no focal motor/sensory deficits  LABORATORY DATA:  I have reviewed the data as listed    Latest Ref Rng & Units 10/23/2022   10:22 AM 06/19/2022    9:22 AM 02/27/2022    3:18 PM  CBC  WBC 4.0 - 10.5 K/uL 5.1  5.8  6.9   Hemoglobin 12.0 - 15.0 g/dL 16.1  09.6  04.5   Hematocrit 36.0 - 46.0 % 35.0  35.2  32.9   Platelets 150 - 400 K/uL 259  248  267        Latest Ref Rng & Units 06/19/2022    9:22 AM 12/04/2021   12:00 AM 03/11/2021    8:13 AM  CMP  Glucose 70 - 99 mg/dL 409   92   BUN 6 - 20 mg/dL 12  11     9    Creatinine 0.44 - 1.00 mg/dL 8.11  0.8     9.14   Sodium 135 - 145 mmol/L 138   140   Potassium 3.5 - 5.1 mmol/L 3.9   4.0   Chloride 98 - 111 mmol/L 105   106   CO2 22 - 32 mmol/L 26   24   Calcium 8.9 - 10.3 mg/dL 9.2  9.0     9.5   Total Protein 6.5 - 8.1 g/dL 7.3   7.3   Total Bilirubin 0.3 - 1.2 mg/dL 0.7   0.3   Alkaline Phos 38 - 126 U/L 94     AST 15 - 41 U/L 11   12   ALT 0 - 44 U/L 7   9      This result is from an external source.    ASSESSMENT & PLAN Alisha Church is a 22 y.o. female  who returns for a follow up for iron  deficiency anemia and B12 deficiency. She will be transferring care from Dr. Angelene Giovanni to Dr. Leonides Schanz.   #Iron Deficiency Anemia -Likely secondary to heavy menstrual bleeding. Currently on oral iron supplementation (Flintstones with iron) and B12 supplementation (dissolvable). -Labs today show Hemoglobin slightly below normal (11.5), MCV 77.1. Iron panel shows serum iron 81, TIBC 328, saturation 25%, ferritin 36. B12 levels 525. -No current symptoms of anemia. Recent episode of significant nosebleed, but coagulation studies (PT, PTT, INR) normal. -Continue B12 supplementation. Advised to switch for Flintsones iron supplement to ferrous sulfate 325 mg PO daily with a source of vitamin C.  -Plan for lab check in 3 months and follow-up visit in 6 months.  #Menorrhagia -Heavy menstrual bleeding for first three days of cycle. Currently being monitored by OB/GYN, no plans for hormonal therapy at this time. -Continue monitoring menstrual cycle and report any changes to OB/GYN.  #Orthostatic Hypotension -Reports dizziness when getting up quickly from a lying position. -Advise to change positions slowly to allow blood pressure to regulate.        No orders of the defined types were placed in this encounter.   All questions were answered. The patient knows to call the clinic with any problems, questions or concerns.  I have spent a total of 25 minutes minutes of face-to-face and non-face-to-face time, preparing to see the patient, performing a medically appropriate  examination, counseling and educating the patient, documenting clinical information in the electronic health record, independently interpreting results and communicating results to the patient, and care coordination.   Georga Kaufmann, PA-C Department of Hematology/Oncology [facility] at [facility] Phone: [phone]

## 2022-11-03 ENCOUNTER — Encounter: Payer: Self-pay | Admitting: Nurse Practitioner

## 2022-11-19 ENCOUNTER — Encounter: Payer: Self-pay | Admitting: Nurse Practitioner

## 2022-12-16 ENCOUNTER — Encounter: Payer: Self-pay | Admitting: Oncology

## 2022-12-23 ENCOUNTER — Encounter: Payer: Self-pay | Admitting: Oncology

## 2022-12-23 ENCOUNTER — Encounter: Payer: Self-pay | Admitting: Nurse Practitioner

## 2022-12-23 ENCOUNTER — Ambulatory Visit (INDEPENDENT_AMBULATORY_CARE_PROVIDER_SITE_OTHER): Payer: Self-pay | Admitting: Nurse Practitioner

## 2022-12-23 VITALS — BP 115/82 | HR 88 | Ht 61.0 in | Wt 136.2 lb

## 2022-12-23 DIAGNOSIS — Z794 Long term (current) use of insulin: Secondary | ICD-10-CM

## 2022-12-23 DIAGNOSIS — E109 Type 1 diabetes mellitus without complications: Secondary | ICD-10-CM

## 2022-12-23 LAB — POCT GLYCOSYLATED HEMOGLOBIN (HGB A1C): Hemoglobin A1C: 7.8 % — AB (ref 4.0–5.6)

## 2022-12-23 MED ORDER — INSULIN ASPART 100 UNIT/ML IJ SOLN: INTRAMUSCULAR | 3 refills | Status: DC

## 2022-12-23 NOTE — Progress Notes (Signed)
Endocrinology Follow Up Note       12/23/2022, 11:02 AM   Subjective:    Patient ID: Alisha Church, female    DOB: Jun 02, 2000.  Alisha Church is being seen in follow up after being seen in consultation for management of currently uncontrolled symptomatic diabetes requested by  Iona Hansen, NP.   Past Medical History:  Diagnosis Date   Acne    Dehydration    Diabetes mellitus    Diabetes mellitus without complication (HCC)    Phreesia 01/10/2020   Ketonuria    Type 1 diabetes mellitus not at goal [facility])     Past Surgical History:  Procedure Laterality Date   PILONIDAL CYST / SINUS EXCISION      Social History   Socioeconomic History   Marital status: Single    Spouse name: Not on file   Number of children: Not on file   Years of education: Not on file   Highest education level: Not on file  Occupational History   Not on file  Tobacco Use   Smoking status: Never   Smokeless tobacco: Never  Substance and Sexual Activity   Alcohol use: No   Drug use: No   Sexual activity: Not Currently    Partners: Male    Birth control/protection: Abstinence    Comment: No hx of STIs  Other Topics Concern   Not on file  Social History Narrative   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 : No  Physical Activity: Not on file   Stress: Not on file  Social Connections: Not on file    Family History  Problem Relation Age of Onset   Thyroid disease Mother    Diabetes Maternal Aunt    Heart disease Maternal Aunt    Hypothyroidism Maternal Grandmother    Thyroid disease Maternal Grandmother    Heart disease Maternal Grandmother    Diabetes Cousin     Outpatient Encounter Medications as of 12/23/2022  Medication Sig   benzoyl peroxide (CERAVE ACNE FOAMING CREAM) 4 % external liquid Apply topically at bedtime.   clindamycin (CLEOCIN T) 1 % lotion Apply 1 Application topically 2 (two) times daily.   Continuous Blood Gluc Sensor (DEXCOM G7 SENSOR) MISC Inject 1 Application into the skin as directed. Change sensor every 10 days as directed.   Cyanocobalamin (VITAMIN B 12 PO) Take 2,500 mcg by mouth daily.   ferrous sulfate 325 (65 FE) MG EC tablet Take 1 tablet (325 mg total) by mouth daily with breakfast.   hydrOXYzine (  ATARAX) 10 MG tablet Take 10 mg by mouth as needed.   Insulin Disposable Pump (OMNIPOD 5 G6 INTRO, GEN 5,) KIT Change pod every 48-72 hours   Insulin Disposable Pump (OMNIPOD 5 G6 PODS, GEN 5,) MISC Change pod every 48-72 hours   Multiple Vitamin (MULTIVITAMIN WITH MINERALS) TABS tablet Take 1 tablet by mouth daily.   PARoxetine (PAXIL) 10 MG tablet Take 10 mg by mouth daily.   Pediatric Multivitamins-Iron (FLINTSTONES PLUS EXTRA IRON PO) Take by mouth daily.   tacrolimus (PROTOPIC) 0.1 % ointment Apply 1 Application topically 2 (two) times daily.   tretinoin (RETIN-A) 0.025 % cream Apply topically at bedtime.   [DISCONTINUED] insulin aspart (NOVOLOG) 100 UNIT/ML injection USE WITH TANDEM INSULIN PUMP FOR TOTAL DAILY DOSE AROUND 50 UNITS.   insulin aspart (NOVOLOG) 100 UNIT/ML injection Use with Omnipod for TDD around around 60 units   No facility-administered encounter medications on file as of 12/23/2022.    ALLERGIES: No Known Allergies  VACCINATION STATUS: Immunization History   Administered Date(s) Administered   DTaP 03/03/1999, 05/14/1999, 07/15/1999, 05/17/2000, 04/11/2004   H1N1 01/17/2008   HIB (PRP-OMP) 03/03/1999, 05/14/1999, 07/15/1999, 05/17/2000   HPV 9-valent 07/24/2010, 09/17/2011, 12/01/2012   HPV Quadrivalent 07/24/2010, 09/17/2011, 12/01/2012   Hepatitis A 11/12/2006, 06/13/2007   Hepatitis A, Adult 11/12/2006, 06/13/2007   Hepatitis A, Ped/Adol-2 Dose 11/12/2006, 06/13/2007   Hepatitis B 15-Nov-1998, 03/12/1999, 07/15/1999   Hepatitis B, PED/ADOLESCENT October 01, 1998, 03/12/1999, 07/15/1999   Hpv-Unspecified 07/24/2010, 09/17/2011   IPV 03/12/1999, 05/14/1999, 02/24/2000, 11/23/2000, 04/11/2004   Influenza Nasal 01/17/2008   Influenza Whole 12/09/2011   Influenza,inj,Quad PF,6+ Mos 11/02/2012, 12/24/2014, 02/08/2018   Influenza,inj,quad, With Preservative 02/26/2014   Influenza-Unspecified 01/17/2008, 11/02/2012, 02/26/2014, 12/24/2014, 11/05/2021   MMR 02/24/2000, 04/11/2004   Meningococcal Conjugate 07/24/2010, 05/13/2016, 05/13/2016   PFIZER Comirnaty(Gray Top)Covid-19 Tri-Sucrose Vaccine 10/09/2020, 11/10/2020   Pneumococcal Conjugate-13 03/03/1999, 05/14/1999, 07/15/1999, 02/24/2000   Pneumococcal Polysaccharide-23 03/03/1999, 05/14/1999   Pneumococcal-Unspecified 03/03/1999, 05/14/1999, 07/15/1999, 02/24/2000   Tdap 07/24/2010, 12/04/2021   Varicella 03/03/1999, 05/14/1999, 05/17/2000, 11/12/2006    Diabetes She presents for her follow-up diabetic visit. She has type 1 diabetes mellitus. Onset time: Diagnosed at approx age of 53. Her disease course has been fluctuating. There are no hypoglycemic associated symptoms. There are no diabetic associated symptoms. There are no hypoglycemic complications. There are no diabetic complications. Risk factors for coronary artery disease include diabetes mellitus. Current diabetic treatment includes insulin pump. She is compliant with treatment most of the time. Her weight is stable. She is following a  generally healthy diet. Meal planning includes avoidance of concentrated sweets. She has had a previous visit with a dietitian. She participates in exercise intermittently. Her home blood glucose trend is fluctuating dramatically. Her overall blood glucose range is 180-200 mg/dl. (She presents today with her CGM and pump showing drastically fluctuating glycemic profile.  Her POCT A1c today is 7.8% essentially unchanged from last visit.   Analysis of her CGM/pump shows TIR 48%, TAR 51%, TBR 1%.  She did call between visits as her insurance got dropped (dad changed jobs) and she is now getting price reduction for her pods.) An ACE inhibitor/angiotensin II receptor blocker is not being taken. She does not see a podiatrist.Eye exam is current.    Review of systems  Constitutional: + Minimally fluctuating body weight,  current Body mass index is 25.73 kg/m. , no fatigue, no subjective hyperthermia, no subjective hypothermia Eyes: no blurry vision, no xerophthalmia ENT: no sore throat, no nodules palpated in throat, no  dysphagia/odynophagia, no hoarseness Cardiovascular: no chest pain, no shortness of breath, no palpitations, no leg swelling Respiratory: no cough, no shortness of breath Gastrointestinal: no nausea/vomiting/diarrhea Musculoskeletal: no muscle/joint aches Skin: no rashes, no hyperemia Neurological: no tremors, no numbness, no tingling, no dizziness Psychiatric: no depression, no anxiety  Objective:     BP 115/82 (BP Location: Left Arm, Patient Position: Sitting, Cuff Size: Large)   Pulse 88   Ht 5\' 1"  (1.549 m)   Wt 136 lb 3.2 oz (61.8 kg)   BMI 25.73 kg/m   Wt Readings from Last 3 Encounters:  12/23/22 136 lb 3.2 oz (61.8 kg)  10/23/22 136 lb 3.2 oz (61.8 kg)  08/19/22 136 lb 9.6 oz (62 kg)     BP Readings from Last 3 Encounters:  12/23/22 115/82  10/23/22 116/76  08/19/22 113/72     Physical Exam- Limited  Constitutional:  Body mass index is 25.73 kg/m. , not in  acute distress, normal state of mind Eyes:  EOMI, no exophthalmos Musculoskeletal: no gross deformities, strength intact in all four extremities, no gross restriction of joint movements Skin:  no rashes, no hyperemia Neurological: no tremor with outstretched hands   Diabetic Foot Exam - Simple   No data filed     CMP ( most recent) CMP     Component Value Date/Time   NA 138 06/19/2022 0922   K 3.9 06/19/2022 0922   CL 105 06/19/2022 0922   CO2 26 06/19/2022 0922   GLUCOSE 174 (H) 06/19/2022 0922   BUN 12 06/19/2022 0922   BUN 11 12/04/2021 0000   CREATININE 0.65 06/19/2022 0922   CREATININE 0.68 03/11/2021 0813   CALCIUM 9.2 06/19/2022 0922   PROT 7.3 06/19/2022 0922   ALBUMIN 4.3 06/19/2022 0922   AST 11 (L) 06/19/2022 0922   ALT 7 06/19/2022 0922   ALKPHOS 94 06/19/2022 0922   BILITOT 0.7 06/19/2022 0922   GFRNONAA >60 06/19/2022 0922   GFRAA NOT CALCULATED 07/19/2010 1509     Diabetic Labs (most recent): Lab Results  Component Value Date   HGBA1C 7.8 (A) 12/23/2022   HGBA1C 7.6 (A) 08/19/2022   HGBA1C 8.0 (A) 05/13/2022   MICROALBUR 10 mg/L 05/13/2022   MICROALBUR 0.4 03/11/2021   MICROALBUR 0.4 09/13/2019     Lipid Panel ( most recent) Lipid Panel     Component Value Date/Time   CHOL 138 03/11/2021 0813   TRIG 55 12/04/2021 0000   HDL 69 03/11/2021 0813   CHOLHDL 2.0 03/11/2021 0813   VLDL 12 04/04/2015 1433   LDLCALC 57 12/04/2021 0000   LDLCALC 59 03/11/2021 0813      Lab Results  Component Value Date   TSH 2.39 09/17/2021   TSH 3.06 03/11/2021   TSH 1.39 09/13/2019   TSH 1.28 10/25/2018   TSH 1.76 06/17/2017   TSH 1.10 04/04/2015   TSH 2.191 01/18/2014   TSH 2.850 10/31/2012   TSH 2.261 12/18/2011   TSH 2.253 10/08/2010   FREET4 0.9 09/17/2021   FREET4 1.0 03/11/2021   FREET4 0.9 09/13/2019   FREET4 1.0 10/25/2018   FREET4 1.2 06/17/2017   FREET4 1.2 04/04/2015   FREET4 1.09 01/18/2014   FREET4 1.07 10/31/2012   FREET4 1.11  12/18/2011   FREET4 1.19 10/08/2010           Assessment & Plan:   1) Type 1 diabetes mellitus without complication (HCC)  She presents today with her CGM and pump showing drastically fluctuating glycemic profile.  Her  POCT A1c today is 7.8% essentially unchanged from last visit.   Analysis of her CGM/pump shows TIR 48%, TAR 51%, TBR 1%.  She did call between visits as her insurance got dropped (dad changed jobs) and she is now getting price reduction for her pods.  Alisha Church has currently uncontrolled symptomatic type 1 DM since 22 years of age.   -Recent labs reviewed.  - I had a long discussion with her about the progressive nature of diabetes and the pathology behind its complications. -her diabetes is not currently complicated but she remains at a high risk for more acute and chronic complications which include CAD, CVA, CKD, retinopathy, and neuropathy. These are all discussed in detail with her.  The following Lifestyle Medicine recommendations according to American College of Lifestyle Medicine [facility]) were discussed and offered to patient and she agrees to start the journey:  A. Whole Foods, Plant-based plate comprising of fruits and vegetables, plant-based proteins, whole-grain carbohydrates was discussed in detail with the patient.   A list for source of those nutrients were also provided to the patient.  Patient will use only water or unsweetened tea for hydration. B.  The need to stay away from risky substances including alcohol, smoking; obtaining 7 to 9 hours of restorative sleep, at least 150 minutes of moderate intensity exercise weekly, the importance of healthy social connections,  and stress reduction techniques were discussed. C.  A full color page of  Calorie density of various food groups per pound showing examples of each food groups was provided to the patient.  - Nutritional counseling repeated at each appointment due to patients tendency to fall back in to  old habits.  - The patient admits there is a room for improvement in their diet and drink choices. -  Suggestion is made for the patient to avoid simple carbohydrates from their diet including Cakes, Sweet Desserts / Pastries, Ice Cream, Soda (diet and regular), Sweet Tea, Candies, Chips, Cookies, Sweet Pastries, Store Bought Juices, Alcohol in Excess of 1-2 drinks a day, Artificial Sweeteners, Coffee Creamer, and "Sugar-free" Products. This will help patient to have stable blood glucose profile and potentially avoid unintended weight gain.   - I encouraged the patient to switch to unprocessed or minimally processed complex starch and increased protein intake (animal or plant source), fruits, and vegetables.   - Patient is advised to stick to a routine mealtimes to eat 3 meals a day and avoid unnecessary snacks (to snack only to correct hypoglycemia).  - I have approached her with the following individualized plan to manage her diabetes and patient agrees:   -I did not make any adjustments to her pump today.    -she is encouraged to continue monitoring glucose 4 times daily (using her CGM), before meals and before bed.   - she is warned not to take insulin without proper monitoring per orders. - Adjustment parameters are given to her for hypo and hyperglycemia in writing. - she is encouraged to call clinic for blood glucose levels less than 70 or above 300 mg /dl.  -Due to her type 1 diagnosis, she is not a candidate for non-insulin therapies.  - Specific targets for  A1c; LDL, HDL, and Triglycerides were discussed with the patient.  2) Blood Pressure /Hypertension:  her blood pressure is controlled to target without the use of antihypertensive medications.     3) Lipids/Hyperlipidemia:    Review of her recent lipid panel from 12/04/21 showed controlled LDL at 57.  She is not currently on any lipid lowering medications.   4)  Weight/Diet:  her Body mass index is 25.73 kg/m.  -   she is  NOT a candidate for weight loss.  Exercise, and detailed carbohydrates information provided  -  detailed on discharge instructions.  5) Chronic Care/Health Maintenance: -she is not on ACEI/ARB or Statin medications and is encouraged to initiate and continue to follow up with Ophthalmology, Dentist, Podiatrist at least yearly or according to recommendations, and advised to stay away from smoking. I have recommended yearly flu vaccine and pneumonia vaccine at least every 5 years; moderate intensity exercise for up to 150 minutes weekly; and sleep for at least 7 hours a day.  - she is advised to maintain close follow up with Iona Hansen, NP for primary care needs, as well as her other providers for optimal and coordinated care.      I spent  25  minutes in the care of the patient today including review of labs from CMP, Lipids, Thyroid Function, Hematology (current and previous including abstractions from other facilities); face-to-face time discussing  her blood glucose readings/logs, discussing hypoglycemia and hyperglycemia episodes and symptoms, medications doses, her options of short and long term treatment based on the latest standards of care / guidelines;  discussion about incorporating lifestyle medicine;  and documenting the encounter. Risk reduction counseling performed per USPSTF guidelines to reduce obesity and cardiovascular risk factors.     Please refer to Patient Instructions for Blood Glucose Monitoring and Insulin/Medications Dosing Guide"  in media tab for additional information. Please  also refer to " Patient Self Inventory" in the Media  tab for reviewed elements of pertinent patient history.  Alisha Church participated in the discussions, expressed understanding, and voiced agreement with the above plans.  All questions were answered to her satisfaction. she is encouraged to contact clinic should she have any questions or concerns prior to her return visit.     Follow up  plan: - Return in about 4 months (around 04/22/2023) for Diabetes F/U with A1c in office, No previsit labs, Bring meter and logs.  Ronny Bacon, [facility] [address] [place], [place] [address] Phone: [phone] Fax: [phone]  12/23/2022, 11:02 AM

## 2023-01-14 ENCOUNTER — Other Ambulatory Visit: Payer: Self-pay

## 2023-01-14 DIAGNOSIS — D500 Iron deficiency anemia secondary to blood loss (chronic): Secondary | ICD-10-CM

## 2023-01-14 DIAGNOSIS — E538 Deficiency of other specified B group vitamins: Secondary | ICD-10-CM

## 2023-01-15 ENCOUNTER — Inpatient Hospital Stay: Payer: Self-pay | Attending: Oncology

## 2023-01-25 ENCOUNTER — Encounter: Payer: Self-pay | Admitting: Oncology

## 2023-02-02 ENCOUNTER — Encounter: Payer: Self-pay | Admitting: Oncology

## 2023-02-02 ENCOUNTER — Inpatient Hospital Stay: Payer: Commercial Managed Care - PPO | Attending: Physician Assistant

## 2023-02-02 DIAGNOSIS — L910 Hypertrophic scar: Secondary | ICD-10-CM | POA: Diagnosis not present

## 2023-02-02 DIAGNOSIS — D509 Iron deficiency anemia, unspecified: Secondary | ICD-10-CM | POA: Diagnosis not present

## 2023-02-02 DIAGNOSIS — E538 Deficiency of other specified B group vitamins: Secondary | ICD-10-CM | POA: Diagnosis not present

## 2023-02-02 DIAGNOSIS — L700 Acne vulgaris: Secondary | ICD-10-CM | POA: Diagnosis not present

## 2023-02-02 DIAGNOSIS — D500 Iron deficiency anemia secondary to blood loss (chronic): Secondary | ICD-10-CM

## 2023-02-02 LAB — CBC WITH DIFFERENTIAL (CANCER CENTER ONLY)
Abs Immature Granulocytes: 0.02 10*3/uL (ref 0.00–0.07)
Basophils Absolute: 0 10*3/uL (ref 0.0–0.1)
Basophils Relative: 1 %
Eosinophils Absolute: 0.1 10*3/uL (ref 0.0–0.5)
Eosinophils Relative: 1 %
HCT: 35.4 % — ABNORMAL LOW (ref 36.0–46.0)
Hemoglobin: 11.9 g/dL — ABNORMAL LOW (ref 12.0–15.0)
Immature Granulocytes: 0 %
Lymphocytes Relative: 27 %
Lymphs Abs: 1.7 10*3/uL (ref 0.7–4.0)
MCH: 24.8 pg — ABNORMAL LOW (ref 26.0–34.0)
MCHC: 33.6 g/dL (ref 30.0–36.0)
MCV: 73.8 fL — ABNORMAL LOW (ref 80.0–100.0)
Monocytes Absolute: 0.4 10*3/uL (ref 0.1–1.0)
Monocytes Relative: 6 %
Neutro Abs: 4 10*3/uL (ref 1.7–7.7)
Neutrophils Relative %: 65 %
Platelet Count: 250 10*3/uL (ref 150–400)
RBC: 4.8 MIL/uL (ref 3.87–5.11)
RDW: 13.1 % (ref 11.5–15.5)
WBC Count: 6.2 10*3/uL (ref 4.0–10.5)
nRBC: 0 % (ref 0.0–0.2)

## 2023-02-02 LAB — VITAMIN B12: Vitamin B-12: 328 pg/mL (ref 180–914)

## 2023-02-02 LAB — IRON AND IRON BINDING CAPACITY (CC-WL,HP ONLY)
Iron: 72 ug/dL (ref 28–170)
Saturation Ratios: 19 % (ref 10.4–31.8)
TIBC: 382 ug/dL (ref 250–450)
UIBC: 310 ug/dL (ref 148–442)

## 2023-02-02 LAB — FERRITIN: Ferritin: 17 ng/mL (ref 11–307)

## 2023-02-03 ENCOUNTER — Encounter: Payer: Self-pay | Admitting: Nurse Practitioner

## 2023-02-03 MED ORDER — INSULIN LISPRO 100 UNIT/ML IJ SOLN
INTRAMUSCULAR | 7 refills | Status: DC
  Filled 2023-04-11: qty 40, 67d supply, fill #0
  Filled 2023-06-11: qty 40, 67d supply, fill #1
  Filled 2023-08-01 – 2023-08-07 (×3): qty 40, 67d supply, fill #2

## 2023-02-27 ENCOUNTER — Other Ambulatory Visit: Payer: Self-pay | Admitting: Pharmacist

## 2023-03-12 DIAGNOSIS — L910 Hypertrophic scar: Secondary | ICD-10-CM | POA: Diagnosis not present

## 2023-03-18 ENCOUNTER — Encounter: Payer: Self-pay | Admitting: Nurse Practitioner

## 2023-03-19 NOTE — Telephone Encounter (Signed)
FYI: Be on the lookout for this patients Dexcom paperwork.

## 2023-03-22 NOTE — Telephone Encounter (Signed)
 Some paperwork has been faxed in for the patient.

## 2023-03-25 ENCOUNTER — Encounter: Payer: Self-pay | Admitting: Nurse Practitioner

## 2023-04-07 ENCOUNTER — Encounter: Payer: Self-pay | Admitting: Physician Assistant

## 2023-04-07 ENCOUNTER — Other Ambulatory Visit: Payer: Self-pay

## 2023-04-07 MED ORDER — CLINDAMYCIN PHOSPHATE 1 % EX LOTN: TOPICAL_LOTION | CUTANEOUS | 1 refills | Status: DC

## 2023-04-07 MED ORDER — TRETINOIN 0.05 % EX CREA
TOPICAL_CREAM | CUTANEOUS | 2 refills | Status: AC
  Filled 2024-01-25: qty 45, 30d supply, fill #0
  Filled 2024-01-26: qty 45, 90d supply, fill #0

## 2023-04-07 MED ORDER — INSULIN ASPART 100 UNIT/ML IJ SOLN: INTRAMUSCULAR | 23 refills | Status: DC

## 2023-04-07 MED ORDER — PAROXETINE HCL 10 MG PO TABS
10.0000 mg | ORAL_TABLET | ORAL | 3 refills | Status: DC
  Filled 2023-04-07: qty 90, 90d supply, fill #0

## 2023-04-12 ENCOUNTER — Other Ambulatory Visit: Payer: Self-pay

## 2023-04-15 ENCOUNTER — Other Ambulatory Visit: Payer: Self-pay | Admitting: Physician Assistant

## 2023-04-15 DIAGNOSIS — E538 Deficiency of other specified B group vitamins: Secondary | ICD-10-CM

## 2023-04-15 DIAGNOSIS — D500 Iron deficiency anemia secondary to blood loss (chronic): Secondary | ICD-10-CM

## 2023-04-16 ENCOUNTER — Other Ambulatory Visit: Payer: Self-pay

## 2023-04-16 ENCOUNTER — Inpatient Hospital Stay: Payer: BC Managed Care – PPO | Attending: Physician Assistant

## 2023-04-16 ENCOUNTER — Other Ambulatory Visit: Payer: Self-pay | Admitting: Physician Assistant

## 2023-04-16 ENCOUNTER — Inpatient Hospital Stay: Payer: BC Managed Care – PPO | Admitting: Physician Assistant

## 2023-04-16 VITALS — BP 116/93 | HR 72 | Temp 97.6°F | Resp 17 | Ht 61.0 in | Wt 139.3 lb

## 2023-04-16 DIAGNOSIS — D500 Iron deficiency anemia secondary to blood loss (chronic): Secondary | ICD-10-CM

## 2023-04-16 DIAGNOSIS — E538 Deficiency of other specified B group vitamins: Secondary | ICD-10-CM | POA: Diagnosis not present

## 2023-04-16 DIAGNOSIS — N920 Excessive and frequent menstruation with regular cycle: Secondary | ICD-10-CM | POA: Insufficient documentation

## 2023-04-16 DIAGNOSIS — Z79899 Other long term (current) drug therapy: Secondary | ICD-10-CM | POA: Insufficient documentation

## 2023-04-16 DIAGNOSIS — D573 Sickle-cell trait: Secondary | ICD-10-CM | POA: Diagnosis not present

## 2023-04-16 LAB — CMP (CANCER CENTER ONLY)
ALT: 9 U/L (ref 0–44)
AST: 10 U/L — ABNORMAL LOW (ref 15–41)
Albumin: 4.3 g/dL (ref 3.5–5.0)
Alkaline Phosphatase: 105 U/L (ref 38–126)
Anion gap: 6 (ref 5–15)
BUN: 8 mg/dL (ref 6–20)
CO2: 26 mmol/L (ref 22–32)
Calcium: 9.3 mg/dL (ref 8.9–10.3)
Chloride: 105 mmol/L (ref 98–111)
Creatinine: 0.66 mg/dL (ref 0.44–1.00)
GFR, Estimated: >60 mL/min (ref >60)
Glucose, Bld: 223 mg/dL — ABNORMAL HIGH (ref 70–99)
Potassium: 3.8 mmol/L (ref 3.5–5.1)
Sodium: 137 mmol/L (ref 135–145)
Total Bilirubin: 0.6 mg/dL (ref 0.0–1.2)
Total Protein: 7.7 g/dL (ref 6.5–8.1)

## 2023-04-16 LAB — CBC WITH DIFFERENTIAL (CANCER CENTER ONLY)
Abs Immature Granulocytes: 0.01 10*3/uL (ref 0.00–0.07)
Basophils Absolute: 0 10*3/uL (ref 0.0–0.1)
Basophils Relative: 1 %
Eosinophils Absolute: 0.1 10*3/uL (ref 0.0–0.5)
Eosinophils Relative: 1 %
HCT: 34.3 % — ABNORMAL LOW (ref 36.0–46.0)
Hemoglobin: 11.3 g/dL — ABNORMAL LOW (ref 12.0–15.0)
Immature Granulocytes: 0 %
Lymphocytes Relative: 28 %
Lymphs Abs: 1.7 10*3/uL (ref 0.7–4.0)
MCH: 24.6 pg — ABNORMAL LOW (ref 26.0–34.0)
MCHC: 32.9 g/dL (ref 30.0–36.0)
MCV: 74.7 fL — ABNORMAL LOW (ref 80.0–100.0)
Monocytes Absolute: 0.4 10*3/uL (ref 0.1–1.0)
Monocytes Relative: 6 %
Neutro Abs: 3.8 10*3/uL (ref 1.7–7.7)
Neutrophils Relative %: 64 %
Platelet Count: 283 10*3/uL (ref 150–400)
RBC: 4.59 MIL/uL (ref 3.87–5.11)
RDW: 13.9 % (ref 11.5–15.5)
WBC Count: 5.9 10*3/uL (ref 4.0–10.5)
nRBC: 0 % (ref 0.0–0.2)

## 2023-04-16 LAB — IRON AND IRON BINDING CAPACITY (CC-WL,HP ONLY)
Iron: 81 ug/dL (ref 28–170)
Saturation Ratios: 20 % (ref 10.4–31.8)
TIBC: 405 ug/dL (ref 250–450)
UIBC: 324 ug/dL (ref 148–442)

## 2023-04-16 LAB — FERRITIN: Ferritin: 11 ng/mL (ref 11–307)

## 2023-04-16 LAB — VITAMIN B12: Vitamin B-12: 311 pg/mL (ref 180–914)

## 2023-04-16 NOTE — Progress Notes (Signed)
 [facility] Telephone:([phone]) [phone]   Fax:([phone]) [phone]  PROGRESS NOTE  Patient Care Team: Iona Hansen, NP as PCP - General (Nurse Practitioner) Loni Muse, MD (Inactive) as Consulting Physician (Hematology) Tanda Rockers, NP as Nurse Practitioner (Radiology)  CHIEF COMPLAINTS/PURPOSE OF CONSULTATION:  Iron deficiency anemia B12 deficiency  HISTORY OF PRESENTING ILLNESS:   Discussed the use of AI scribe software for clinical note transcription with the patient, who gave verbal consent to proceed.  History of Present Illness The patient, with a history of iron and B12 deficiency anemia, reports feeling 'really good' with 'a lot of energy' compared to previous years. She notes that her menstrual cycles have been less heavy, occurring every four to five weeks and lasting three to four days, with the first two days being the heaviest. This pattern has been consistent and unchanged. She has been taking Flintstone iron supplements daily, though she occasionally forgets. She denies any other signs of bleeding such as blood in the stool, nose bleeds, or gum bleeding. She also denies shortness of breath, dizziness, and cravings for ice, which she had experienced when her anemia was more severe. She is otherwise feeling well without any fevers, chills, sweats, chest pain, cough or headaches. Rest of the 10 point ROS is below     MEDICAL HISTORY:  Past Medical History:  Diagnosis Date   Acne    Dehydration    Diabetes mellitus    Diabetes mellitus without complication (HCC)    Phreesia 01/10/2020   Ketonuria    Type 1 diabetes mellitus not at goal [facility])     SURGICAL HISTORY: Past Surgical History:  Procedure Laterality Date   PILONIDAL CYST / SINUS EXCISION      SOCIAL HISTORY: Social History   Socioeconomic History   Marital status: Single    Spouse name: Not on file   Number of children: Not on file   Years of education: Not on file    Highest education level: Not on file  Occupational History   Not on file  Tobacco Use   Smoking status: Never   Smokeless tobacco: Never  Substance and Sexual Activity   Alcohol use: No   Drug use: No   Sexual activity: Not Currently    Partners: Male    Birth control/protection: Abstinence    Comment: No hx of STIs  Other Topics Concern   Not on file  Social History Narrative   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 : No  Physical Activity: Not on file  Stress: Not on file  Social Connections: Not on file  Intimate Partner Violence: Not on file    FAMILY HISTORY: Family History  Problem Relation Age of Onset   Thyroid disease Mother    Diabetes Maternal Aunt    Heart disease Maternal Aunt  Hypothyroidism Maternal Grandmother    Thyroid disease Maternal Grandmother    Heart disease Maternal Grandmother    Diabetes Cousin     ALLERGIES:  has no known allergies.  MEDICATIONS:  Current Outpatient Medications  Medication Sig Dispense Refill   benzoyl peroxide (CERAVE ACNE FOAMING CREAM) 4 % external liquid Apply topically at bedtime.     clindamycin (CLEOCIN T) 1 % lotion Apply 1 Application topically 2 (two) times daily.     Continuous Blood Gluc Sensor (DEXCOM G7 SENSOR) MISC Inject 1 Application into the skin as directed. Change sensor every 10 days as directed. 6 each 3   Cyanocobalamin  (VITAMIN B 12 PO) Take 2,500 mcg by mouth daily.     ferrous sulfate 325 (65 FE) MG tablet Take 1 tablet (325 mg total) by mouth daily with breakfast. 30 tablet 6   hydrOXYzine (ATARAX) 10 MG tablet Take 10 mg by mouth as needed.     Insulin Disposable Pump (OMNIPOD 5 DEXG7G6 PODS GEN 5) MISC Change pod every 48-72 hours 10 each 11   Insulin Disposable Pump (OMNIPOD 5 G6 INTRO, GEN 5,) KIT Change pod every 48-72 hours 1 kit 0   insulin lispro (HUMALOG) 100 UNIT/ML injection Use with Omnipod for TDD around 60 units daily 40 mL 7   Multiple Vitamin (MULTIVITAMIN WITH MINERALS) TABS tablet Take 1 tablet by mouth daily.     PARoxetine (PAXIL) 10 MG tablet Take 1 tablet (10 mg total) by mouth every morning. 90 tablet 3   Pediatric Multivitamins-Iron (FLINTSTONES PLUS EXTRA IRON PO) Take by mouth daily.     tretinoin (RETIN-A) 0.05 % cream APPLY PEA SIZED AMOUNT ONTO AFFECTED AREA OF THE FACE IN THE EVENING 45 g 2   clindamycin (CLEOCIN T) 1 % lotion apply to affected area every morning (Patient not taking: Reported on 04/16/2023) 60 mL 1   insulin aspart (NOVOLOG) 100 UNIT/ML injection Use with Tandem insulin pump for total daily  dose around 50 Units. (Patient not taking: Reported on 04/16/2023) 10 mL 23   PARoxetine (PAXIL) 10 MG tablet Take 10 mg by mouth daily.     tacrolimus (PROTOPIC) 0.1 % ointment Apply 1 Application topically 2 (two) times daily. (Patient not taking: Reported on 04/16/2023)     tretinoin (RETIN-A) 0.025 % cream Apply topically at bedtime.     No current facility-administered medications for this visit.    REVIEW OF SYSTEMS:   Constitutional: ( - ) fevers, ( - )  chills , ( - ) night sweats Eyes: ( - ) blurriness of vision, ( - ) double vision, ( - ) watery eyes Ears, nose, mouth, throat, and face: ( - ) mucositis, ( - ) sore throat Respiratory: ( - ) cough, ( - ) dyspnea, ( - ) wheezes Cardiovascular: ( - ) palpitation, ( - ) chest discomfort, ( - ) lower extremity  swelling Gastrointestinal:  ( - ) nausea, ( - ) heartburn, ( - ) change in bowel habits Skin: ( - ) abnormal skin rashes Lymphatics: ( - ) new lymphadenopathy, ( - ) easy bruising Neurological: ( - ) numbness, ( - ) tingling, ( - ) new weaknesses Behavioral/Psych: ( - ) mood change, ( - ) new changes  All other systems were reviewed with the patient and are negative.  PHYSICAL EXAMINATION: ECOG PERFORMANCE STATUS: 0 - Asymptomatic  Vitals:   04/16/23 1101  BP: (!) 116/93  Pulse: 72  Resp: 17  Temp: 97.6 F (36.4 C)  SpO2: 100%   Filed Weights   04/16/23 1101  Weight: 139 lb 4.8 oz (63.2 kg)    GENERAL: well appearing female in NAD  SKIN: skin color, texture, turgor are normal, no rashes or significant lesions EYES: conjunctiva are pink and non-injected, sclera clear LUNGS: clear to auscultation and percussion with normal breathing effort HEART: regular rate & rhythm and no murmurs and no lower extremity edema Musculoskeletal: no cyanosis of digits and no clubbing  PSYCH: alert & oriented x 3, fluent speech NEURO: no focal motor/sensory deficits  LABORATORY DATA:  I have reviewed the data as listed    Latest Ref Rng & Units 04/16/2023   10:39 AM 02/02/2023   11:45 AM 10/23/2022   10:22 AM  CBC  WBC 4.0 - 10.5 K/uL 5.9  6.2  5.1   Hemoglobin 12.0 - 15.0 g/dL 04.5  40.9  81.1   Hematocrit 36.0 - 46.0 % 34.3  35.4  35.0   Platelets 150 - 400 K/uL 283  250  259        Latest Ref Rng & Units 04/16/2023   10:39 AM 06/19/2022    9:22 AM 12/04/2021   12:00 AM  CMP  Glucose 70 - 99 mg/dL 914  782    BUN 6 - 20 mg/dL 8  12  11       Creatinine 0.44 - 1.00 mg/dL 9.56  2.13  0.8      Sodium 135 - 145 mmol/L 137  138    Potassium 3.5 - 5.1 mmol/L 3.8  3.9    Chloride 98 - 111 mmol/L 105  105    CO2 22 - 32 mmol/L 26  26    Calcium 8.9 - 10.3 mg/dL 9.3  9.2  9.0      Total Protein 6.5 - 8.1 g/dL 7.7  7.3    Total Bilirubin 0.0 - 1.2 mg/dL 0.6  0.7    Alkaline Phos 38 - 126  U/L 105  94    AST 15 - 41 U/L 10  11    ALT 0 - 44 U/L 9  7       This result is from an external source.    ASSESSMENT & PLAN Alisha Church is a 23 y.o. female  who returns for a follow up for iron deficiency anemia and B12 deficiency.  Assessment & Plan #Iron deficiency anemia 2/2 menorrhagia: - Improved energy and reduced pica indicate anemia improvement. Previous labs from 02/02/23 showed iron levels low end of normal. B12 levels decreased but within normal range two months ago. - Patient has sickle cell trait which can contribute to her anemia.  - Labs today show mild anemia with Hgb 11.3. MCV 74.7. Iron panel pending.; administer IV iron if ferritin<20. - Recheck B12 levels; switch to injections if low. - Continue daily Flintstone iron supplements. - Continue daily dissolvable B12 supplement.  Follow-up - Schedule lab-only visit in 3 months. - Schedule follow-up visit in 6 months.   Orders Placed This Encounter  Procedures   CBC with Differential (Cancer Center Only)    Standing Status:   Future    Expected Date:   07/17/2023    Expiration Date:   04/15/2024   Ferritin    Standing Status:   Future    Expected Date:   07/17/2023    Expiration Date:   04/15/2024   Iron and Iron Binding Capacity ([facility] only)    Standing Status:   Future    Expected Date:  07/17/2023    Expiration Date:   04/15/2024   Vitamin B12    Standing Status:   Future    Expected Date:   07/17/2023    Expiration Date:   04/15/2024    All questions were answered. The patient knows to call the clinic with any problems, questions or concerns.  I have spent a total of 25 minutes minutes of face-to-face and non-face-to-face time, preparing to see the patient, performing a medically appropriate examination, counseling and educating the patient, documenting clinical information in the electronic health record, independently interpreting results and communicating results to the patient, and care  coordination.   Georga Kaufmann, PA-C Department of Hematology/Oncology [facility] Phone: [phone]

## 2023-04-20 ENCOUNTER — Other Ambulatory Visit: Payer: Self-pay | Admitting: Physician Assistant

## 2023-04-20 ENCOUNTER — Telehealth: Payer: Self-pay

## 2023-04-20 NOTE — Telephone Encounter (Addendum)
 Delores Fester, patient will be scheduled as soon as possible.  Auth Submission: NO AUTH NEEDED Site of care: Site of care: [facility] Payer: [employer] commercial Medication & CPT/J Code(s) submitted: Venofer  (Iron  Sucrose) J1756 Route of submission (phone, fax, portal):  Phone # Fax # Auth type: Buy/Bill PB Units/visits requested: 200mg  x 6 doses Reference number:  Approval from: 04/20/23 to 10/21/23

## 2023-04-21 ENCOUNTER — Encounter: Payer: Self-pay | Admitting: Nurse Practitioner

## 2023-04-21 NOTE — Telephone Encounter (Signed)
 See patient response.

## 2023-04-21 NOTE — Telephone Encounter (Signed)
 See patient message about rescheduling appt.  I can do hers as a mychart visit since in can see her data for [employer].

## 2023-04-22 ENCOUNTER — Ambulatory Visit: Payer: Self-pay | Admitting: Nurse Practitioner

## 2023-04-22 DIAGNOSIS — Z794 Long term (current) use of insulin: Secondary | ICD-10-CM

## 2023-04-22 DIAGNOSIS — E109 Type 1 diabetes mellitus without complications: Secondary | ICD-10-CM

## 2023-04-23 ENCOUNTER — Telehealth: Admitting: Nurse Practitioner

## 2023-04-27 ENCOUNTER — Telehealth: Admitting: Nurse Practitioner

## 2023-04-27 ENCOUNTER — Encounter: Payer: Self-pay | Admitting: Nurse Practitioner

## 2023-04-27 DIAGNOSIS — E109 Type 1 diabetes mellitus without complications: Secondary | ICD-10-CM

## 2023-04-27 DIAGNOSIS — Z794 Long term (current) use of insulin: Secondary | ICD-10-CM

## 2023-04-27 NOTE — Progress Notes (Signed)
 Endocrinology Follow Up Note       04/27/2023, 1:09 PM   TELEHEALTH VISIT: The patient is being engaged in telehealth visit.  This type of visit limits physical examination significantly, and thus is not preferable over face-to-face encounters.  I connected with  Alisha Church on 04/27/23 by a video enabled telemedicine application and verified that I am speaking with the correct person using two identifiers.   I discussed the limitations of evaluation and management by telemedicine. The patient expressed understanding and agreed to proceed.    The participants involved in this visit include: Dani Gobble, NP located at [facility] and Alisha Church  located at their personal residence listed.   Subjective:    Patient ID: Alisha Church, female    DOB: 09/11/2000.  Alisha Church is being seen in follow up after being seen in consultation for management of currently uncontrolled symptomatic diabetes requested by  Iona Hansen, NP.   Past Medical History:  Diagnosis Date   Acne    Dehydration    Diabetes mellitus    Diabetes mellitus without complication (HCC)    Phreesia 01/10/2020   Ketonuria    Type 1 diabetes mellitus not at goal [facility])     Past Surgical History:  Procedure Laterality Date   PILONIDAL CYST / SINUS EXCISION      Social History   Socioeconomic History   Marital status: Single    Spouse name: Not on file   Number of children: Not on file   Years of education: Not on file   Highest education level: Not on file  Occupational History   Not on file  Tobacco Use   Smoking status: Never   Smokeless tobacco: Never  Substance and Sexual Activity   Alcohol use: No   Drug use: No   Sexual activity: Not Currently    Partners: Male    Birth control/protection: Abstinence    Comment: No hx of STIs  Other Topics Concern   Not on file  Social History  Narrative   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 : No  Physical Activity: Not on file  Stress: Not on file  Social Connections: Not on file    Family History  Problem Relation Age of Onset   Thyroid disease Mother    Diabetes Maternal Aunt    Heart disease Maternal Aunt    Hypothyroidism Maternal Grandmother    Thyroid disease Maternal Grandmother    Heart disease Maternal Grandmother    Diabetes Cousin  Outpatient Encounter Medications as of 04/27/2023  Medication Sig   benzoyl peroxide (CERAVE ACNE FOAMING CREAM) 4 % external liquid Apply topically at bedtime.   clindamycin (CLEOCIN T) 1 % lotion Apply 1 Application topically 2 (two) times daily.   Continuous Blood Gluc Sensor (DEXCOM G7 SENSOR) MISC Inject 1 Application into the skin as directed. Change sensor every 10 days as directed.   Cyanocobalamin (VITAMIN B 12 PO) Take 2,500 mcg by mouth daily.   ferrous sulfate 325 (65 FE) MG tablet Take 1 tablet (325 mg total) by mouth daily with breakfast.   hydrOXYzine (ATARAX) 10 MG tablet Take 10 mg by mouth as needed.   Insulin Disposable Pump (OMNIPOD 5 DEXG7G6 PODS GEN 5) MISC Change pod every 48-72 hours   Insulin Disposable Pump (OMNIPOD 5 G6 INTRO, GEN 5,) KIT Change pod every 48-72 hours   insulin lispro (HUMALOG) 100 UNIT/ML injection Use with Omnipod for TDD around 60 units daily   Multiple  Vitamin (MULTIVITAMIN WITH MINERALS) TABS tablet Take 1 tablet by mouth daily.   PARoxetine (PAXIL) 10 MG tablet Take 1 tablet (10 mg total) by mouth every morning.   Pediatric Multivitamins-Iron (FLINTSTONES PLUS EXTRA IRON PO) Take by mouth daily.   tretinoin (RETIN-A) 0.05 % cream APPLY PEA SIZED AMOUNT ONTO AFFECTED AREA OF THE FACE IN THE EVENING   No facility-administered encounter medications on file as of 04/27/2023.    ALLERGIES: No Known Allergies  VACCINATION STATUS: Immunization History  Administered Date(s) Administered   DTaP 03/03/1999, 05/14/1999, 07/15/1999, 05/17/2000, 04/11/2004   H1N1 01/17/2008   HIB (PRP-OMP) 03/03/1999, 05/14/1999, 07/15/1999, 05/17/2000   HPV 9-valent 07/24/2010, 09/17/2011, 12/01/2012   HPV Quadrivalent 07/24/2010, 09/17/2011, 12/01/2012   Hepatitis A 11/12/2006, 06/13/2007   Hepatitis A, Adult 11/12/2006, 06/13/2007   Hepatitis A, Ped/Adol-2 Dose 11/12/2006, 06/13/2007   Hepatitis B 07-02-1998, 03/12/1999, 07/15/1999   Hepatitis B, PED/ADOLESCENT 05/21/98, 03/12/1999, 07/15/1999   Hpv-Unspecified 07/24/2010, 09/17/2011   IPV 03/12/1999, 05/14/1999, 02/24/2000, 11/23/2000, 04/11/2004   Influenza Nasal 01/17/2008   Influenza Whole 12/09/2011   Influenza,inj,Quad PF,6+ Mos 11/02/2012, 12/24/2014, 02/08/2018   Influenza,inj,quad, With Preservative 02/26/2014   Influenza-Unspecified 01/17/2008, 11/02/2012, 02/26/2014, 12/24/2014, 11/05/2021   MMR 02/24/2000, 04/11/2004   Meningococcal Conjugate 07/24/2010, 05/13/2016, 05/13/2016   PFIZER Comirnaty(Gray Top)Covid-19 Tri-Sucrose Vaccine 10/09/2020, 11/10/2020   Pneumococcal Conjugate-13 03/03/1999, 05/14/1999, 07/15/1999, 02/24/2000   Pneumococcal Polysaccharide-23 03/03/1999, 05/14/1999   Pneumococcal-Unspecified 03/03/1999, 05/14/1999, 07/15/1999, 02/24/2000   Tdap 07/24/2010, 12/04/2021   Varicella 03/03/1999, 05/14/1999, 05/17/2000, 11/12/2006    Diabetes She presents for her follow-up  diabetic visit. She has type 1 diabetes mellitus. Onset time: Diagnosed at approx age of 11. Her disease course has been fluctuating. There are no hypoglycemic associated symptoms. There are no diabetic associated symptoms. There are no hypoglycemic complications. There are no diabetic complications. Risk factors for coronary artery disease include diabetes mellitus. Current diabetic treatment includes insulin pump. She is compliant with treatment most of the time. Her weight is stable. She is following a generally healthy diet. Meal planning includes avoidance of concentrated sweets. She has had a previous visit with a dietitian. She participates in exercise intermittently. Her home blood glucose trend is fluctuating dramatically. Her overall blood glucose range is 180-200 mg/dl. (She presents today for her virtual visit with her CGM and pump data (obtained from [facility]), showing drastically fluctuating glycemic profile. Analysis of her CGM shows TIR 54%, TAR 45%, TBR 1%.  Her basal/bolus ratio is more 70/30 vs 50/50.  She admits she under-counts her carbs  for fear that she will have a low glucose following her bolus.  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 She does not see a podiatrist.Eye exam is current.    Review of systems  Constitutional: + Minimally fluctuating body weight,  current There is no height or weight on file to calculate BMI. , no fatigue, no subjective hyperthermia, no subjective hypothermia Eyes: no blurry vision, no xerophthalmia ENT: no sore throat, no nodules palpated in throat, no dysphagia/odynophagia, no hoarseness Cardiovascular: no chest pain, no shortness of breath, no palpitations, no leg swelling Respiratory: no cough, no shortness of breath Gastrointestinal: no nausea/vomiting/diarrhea Musculoskeletal: no muscle/joint  aches Skin: no rashes, no hyperemia Neurological: no tremors, no numbness, no tingling, no dizziness Psychiatric: no depression, no anxiety  Objective:     There were no vitals taken for this visit.  Wt Readings from Last 3 Encounters:  04/16/23 139 lb 4.8 oz (63.2 kg)  12/23/22 136 lb 3.2 oz (61.8 kg)  10/23/22 136 lb 3.2 oz (61.8 kg)     BP Readings from Last 3 Encounters:  04/16/23 (!) 116/93  12/23/22 115/82  10/23/22 116/76     Physical Exam- Telehealth- significantly limited due to nature of visit  Constitutional: There is no height or weight on file to calculate BMI. , not in acute distress, normal state of mind Respiratory: Adequate breathing efforts   Diabetic Foot Exam - Simple   No data filed     CMP ( most recent) CMP     Component Value Date/Time   NA 137 04/16/2023 1039   K 3.8 04/16/2023 1039   CL 105 04/16/2023 1039   CO2 26 04/16/2023 1039   GLUCOSE 223 (H) 04/16/2023 1039   BUN 8 04/16/2023 1039   BUN 11 12/04/2021 0000   CREATININE 0.66 04/16/2023 1039   CREATININE 0.68 03/11/2021 0813   CALCIUM 9.3 04/16/2023 1039   PROT 7.7 04/16/2023 1039   ALBUMIN 4.3 04/16/2023 1039   AST 10 (L) 04/16/2023 1039   ALT 9 04/16/2023 1039   ALKPHOS 105 04/16/2023 1039   BILITOT 0.6 04/16/2023 1039   GFRNONAA >60 04/16/2023 1039   GFRAA NOT CALCULATED 07/19/2010 1509     Diabetic Labs (most recent): Lab Results  Component Value Date   HGBA1C 7.8 (A) 12/23/2022   HGBA1C 7.6 (A) 08/19/2022   HGBA1C 8.0 (A) 05/13/2022   MICROALBUR 10 mg/L 05/13/2022   MICROALBUR 0.4 03/11/2021   MICROALBUR 0.4 09/13/2019     Lipid Panel ( most recent) Lipid Panel     Component Value Date/Time   CHOL 138 03/11/2021 0813   TRIG 55 12/04/2021 0000   HDL 69 03/11/2021 0813   CHOLHDL 2.0 03/11/2021 0813   VLDL 12 04/04/2015 1433   LDLCALC 57 12/04/2021 0000   LDLCALC 59 03/11/2021 0813      Lab Results  Component Value Date   TSH 2.39 09/17/2021   TSH  3.06 03/11/2021   TSH 1.39 09/13/2019   TSH 1.28 10/25/2018   TSH 1.76 06/17/2017   TSH 1.10 04/04/2015   TSH 2.191 01/18/2014   TSH 2.850 10/31/2012   TSH 2.261 12/18/2011   TSH 2.253 10/08/2010   FREET4 0.9 09/17/2021   FREET4 1.0 03/11/2021   FREET4 0.9 09/13/2019   FREET4 1.0 10/25/2018   FREET4 1.2 06/17/2017   FREET4 1.2 04/04/2015   FREET4  1.09 01/18/2014   FREET4 1.07 10/31/2012   FREET4 1.11 12/18/2011   FREET4 1.19 10/08/2010           Assessment & Plan:   1) Type 1 diabetes mellitus without complication (HCC)  She presents today for her virtual visit with her CGM and pump data (obtained from [facility]), showing drastically fluctuating glycemic profile. Analysis of her CGM shows TIR 54%, TAR 45%, TBR 1%.  Her basal/bolus ratio is more 70/30 vs 50/50.  She admits she under-counts her carbs for fear that she will have a low glucose following her bolus.  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  Alisha Church has currently uncontrolled symptomatic type 1 DM since 23 years of age.   -Recent labs reviewed.  - I had a long discussion with her about the progressive nature of diabetes and the pathology behind its complications. -her diabetes is not currently complicated but she remains at a high risk for more acute and chronic complications which include CAD, CVA, CKD, retinopathy, and neuropathy. These are all discussed in detail with her.  The following Lifestyle Medicine recommendations according to American College of Lifestyle Medicine [facility]) were discussed and offered to patient and she agrees to start the journey:  A. Whole Foods, Plant-based plate comprising of fruits and vegetables, plant-based proteins, whole-grain carbohydrates was discussed in detail with the patient.   A list for source of those nutrients were also provided to the patient.  Patient will use only water or  unsweetened tea for hydration. B.  The need to stay away from risky substances including alcohol, smoking; obtaining 7 to 9 hours of restorative sleep, at least 150 minutes of moderate intensity exercise weekly, the importance of healthy social connections,  and stress reduction techniques were discussed. C.  A full color page of  Calorie density of various food groups per pound showing examples of each food groups was provided to the patient.  - Nutritional counseling repeated at each appointment due to patients tendency to fall back in to old habits.  - The patient admits there is a room for improvement in their diet and drink choices. -  Suggestion is made for the patient to avoid simple carbohydrates from their diet including Cakes, Sweet Desserts / Pastries, Ice Cream, Soda (diet and regular), Sweet Tea, Candies, Chips, Cookies, Sweet Pastries, Store Bought Juices, Alcohol in Excess of 1-2 drinks a day, Artificial Sweeteners, Coffee Creamer, and "Sugar-free" Products. This will help patient to have stable blood glucose profile and potentially avoid unintended weight gain.   - I encouraged the patient to switch to unprocessed or minimally processed complex starch and increased protein intake (animal or plant source), fruits, and vegetables.   - Patient is advised to stick to a routine mealtimes to eat 3 meals a day and avoid unnecessary snacks (to snack only to correct hypoglycemia).  - I have approached her with the following individualized plan to manage her diabetes and patient agrees:   -I did not make any adjustments to her pump today.  Instead, we talked about trying to calculate her carbs a bit closer and trust the pump to help calculate proper pre-meal boluses.  -she is encouraged to continue monitoring glucose 4 times daily (using her CGM), before meals and before bed.   - she is warned not to take insulin without proper monitoring per orders. - Adjustment parameters are given to her  for  hypo and hyperglycemia in writing. - she is encouraged to call clinic for blood glucose levels less than 70 or above 300 mg /dl.  -Due to her type 1 diagnosis, she is not a candidate for non-insulin therapies.  - Specific targets for  A1c; LDL, HDL, and Triglycerides were discussed with the patient.  2) Blood Pressure /Hypertension:  her blood pressure is controlled to target without the use of antihypertensive medications.     3) Lipids/Hyperlipidemia:    Review of her recent lipid panel from 12/04/21 showed controlled LDL at 57.  She is not currently on any lipid lowering medications.   She has annual physical coming up with her PCP.    4)  Weight/Diet:  her There is no height or weight on file to calculate BMI.  -   she is NOT a candidate for weight loss.  Exercise, and detailed carbohydrates information provided  -  detailed on discharge instructions.  5) Chronic Care/Health Maintenance: -she is not on ACEI/ARB or Statin medications and is encouraged to initiate and continue to follow up with Ophthalmology, Dentist, Podiatrist at least yearly or according to recommendations, and advised to stay away from smoking. I have recommended yearly flu vaccine and pneumonia vaccine at least every 5 years; moderate intensity exercise for up to 150 minutes weekly; and sleep for at least 7 hours a day.  - she is advised to maintain close follow up with Iona Hansen, NP for primary care needs, as well as her other providers for optimal and coordinated care.       I spent 21 minutes dedicated to the care of this patient on the date of this encounter to include pre-visit review of records, face-to-face time with the patient, and post visit ordering of testing.    Please refer to Patient Instructions for Blood Glucose Monitoring and Insulin/Medications Dosing Guide"  in media tab for additional information. Please  also refer to " Patient Self Inventory" in the Media  tab for reviewed elements of  pertinent patient history.  Alisha Church participated in the discussions, expressed understanding, and voiced agreement with the above plans.  All questions were answered to her satisfaction. she is encouraged to contact clinic should she have any questions or concerns prior to her return visit.     Follow up plan: - Return in about 4 months (around 08/27/2023) for Diabetes F/U with A1c in office, No previsit labs, Bring meter and logs.  Ronny Bacon, [facility] [address] [place], [place] [address] Phone: [phone] Fax: [phone]  04/27/2023, 1:09 PM

## 2023-05-03 ENCOUNTER — Other Ambulatory Visit: Payer: Self-pay

## 2023-05-07 ENCOUNTER — Other Ambulatory Visit: Payer: Self-pay

## 2023-05-18 DIAGNOSIS — E109 Type 1 diabetes mellitus without complications: Secondary | ICD-10-CM | POA: Diagnosis not present

## 2023-05-18 DIAGNOSIS — E063 Autoimmune thyroiditis: Secondary | ICD-10-CM | POA: Diagnosis not present

## 2023-05-18 DIAGNOSIS — Z0000 Encounter for general adult medical examination without abnormal findings: Secondary | ICD-10-CM | POA: Diagnosis not present

## 2023-05-18 DIAGNOSIS — F419 Anxiety disorder, unspecified: Secondary | ICD-10-CM | POA: Diagnosis not present

## 2023-05-20 ENCOUNTER — Other Ambulatory Visit: Payer: Self-pay

## 2023-05-20 MED ORDER — PROPRANOLOL HCL 10 MG PO TABS
ORAL_TABLET | ORAL | 0 refills | Status: AC
  Filled 2023-05-20: qty 30, 15d supply, fill #0

## 2023-05-24 ENCOUNTER — Other Ambulatory Visit: Payer: Self-pay

## 2023-05-27 ENCOUNTER — Ambulatory Visit

## 2023-05-27 DIAGNOSIS — J029 Acute pharyngitis, unspecified: Secondary | ICD-10-CM | POA: Diagnosis not present

## 2023-05-27 MED ORDER — IRON SUCROSE 20 MG/ML IV SOLN
200.0000 mg | Freq: Once | INTRAVENOUS | Status: AC
Start: 2023-05-27 — End: ?

## 2023-06-01 ENCOUNTER — Other Ambulatory Visit (HOSPITAL_COMMUNITY): Payer: Self-pay

## 2023-06-03 ENCOUNTER — Ambulatory Visit (INDEPENDENT_AMBULATORY_CARE_PROVIDER_SITE_OTHER)

## 2023-06-03 VITALS — BP 106/70 | HR 92 | Temp 98.7°F | Resp 20 | Ht 61.0 in | Wt 138.0 lb

## 2023-06-03 DIAGNOSIS — N938 Other specified abnormal uterine and vaginal bleeding: Secondary | ICD-10-CM

## 2023-06-03 DIAGNOSIS — D500 Iron deficiency anemia secondary to blood loss (chronic): Secondary | ICD-10-CM | POA: Diagnosis not present

## 2023-06-03 MED ORDER — IRON SUCROSE 20 MG/ML IV SOLN
200.0000 mg | Freq: Once | INTRAVENOUS | Status: AC
  Administered 2023-06-03: 200 mg via INTRAVENOUS
  Filled 2023-06-03: qty 10

## 2023-06-03 NOTE — Progress Notes (Signed)
 Diagnosis: Iron  Deficiency Anemia  Provider:  Mannam, Praveen MD  Procedure: IV Push  IV Type: Peripheral, IV Location: R Antecubital  Venofer  (Iron  Sucrose), Dose: 500 mg  Post Infusion IV Care: Observation period completed and Peripheral IV Discontinued  Discharge: Condition: Stable, Destination: Home . AVS Declined  Performed by:  Lendel Quant, RN

## 2023-06-10 ENCOUNTER — Ambulatory Visit: Admitting: *Deleted

## 2023-06-10 VITALS — BP 114/78 | HR 68 | Temp 97.9°F | Resp 18 | Ht 61.0 in | Wt 141.2 lb

## 2023-06-10 DIAGNOSIS — D500 Iron deficiency anemia secondary to blood loss (chronic): Secondary | ICD-10-CM | POA: Diagnosis not present

## 2023-06-10 DIAGNOSIS — N938 Other specified abnormal uterine and vaginal bleeding: Secondary | ICD-10-CM

## 2023-06-10 MED ORDER — SODIUM CHLORIDE 0.9 % IV BOLUS
250.0000 mL | Freq: Once | INTRAVENOUS | Status: AC
  Administered 2023-06-10: 250 mL via INTRAVENOUS
  Filled 2023-06-10: qty 250

## 2023-06-10 MED ORDER — IRON SUCROSE 20 MG/ML IV SOLN
200.0000 mg | Freq: Once | INTRAVENOUS | Status: AC
  Administered 2023-06-10: 200 mg via INTRAVENOUS
  Filled 2023-06-10: qty 10

## 2023-06-10 NOTE — Progress Notes (Signed)
 Diagnosis: Iron  Deficiency Anemia  Provider:  Mannam, Praveen MD  Procedure: IV Push  IV Type: Peripheral, IV Location: L Hand  Venofer  (Iron  Sucrose), Dose: 200 mg  Post Infusion IV Care: Observation period completed  Discharge: Condition: Good, Destination: Home . AVS Declined  Performed by:  Devonne Folk, RN

## 2023-06-16 ENCOUNTER — Ambulatory Visit (INDEPENDENT_AMBULATORY_CARE_PROVIDER_SITE_OTHER)

## 2023-06-16 VITALS — BP 116/74 | HR 88 | Temp 98.1°F | Resp 16 | Ht 61.0 in | Wt 142.0 lb

## 2023-06-16 DIAGNOSIS — D500 Iron deficiency anemia secondary to blood loss (chronic): Secondary | ICD-10-CM | POA: Diagnosis not present

## 2023-06-16 DIAGNOSIS — N938 Other specified abnormal uterine and vaginal bleeding: Secondary | ICD-10-CM

## 2023-06-16 MED ORDER — IRON SUCROSE 20 MG/ML IV SOLN
200.0000 mg | Freq: Once | INTRAVENOUS | Status: AC
  Administered 2023-06-16: 200 mg via INTRAVENOUS
  Filled 2023-06-16: qty 10

## 2023-06-16 NOTE — Progress Notes (Signed)
 Diagnosis: Iron  Deficiency Anemia  Provider:  Praveen Mannam MD  Procedure: IV Push  IV Type: Peripheral, IV Location: R Hand  Venofer  (Iron  Sucrose), Dose: 200 mg  Post Infusion IV Care: Patient declined observation and Peripheral IV Discontinued  Discharge: Condition: Good, Destination: Home . AVS Declined  Performed by:  Lauran Pollard, LPN

## 2023-06-18 ENCOUNTER — Encounter: Payer: Self-pay | Admitting: Nurse Practitioner

## 2023-06-28 NOTE — Telephone Encounter (Signed)
 Patient picked up a sensor last week.

## 2023-07-02 ENCOUNTER — Other Ambulatory Visit (HOSPITAL_BASED_OUTPATIENT_CLINIC_OR_DEPARTMENT_OTHER): Payer: Self-pay

## 2023-07-05 ENCOUNTER — Encounter: Payer: Self-pay | Admitting: Nurse Practitioner

## 2023-07-06 NOTE — Telephone Encounter (Signed)
 Patient was called and a voicemail was left on her cell phone. I shared Dr. Chrystine Crate recommendations and I also printed her CGM Data. The dates are 06/13/2023 --- 06/08/2023. Will share with Dr. Monte Antonio. It was emphasized to the patient in the message that was left, per Dr. Monte Antonio if she could  not bring her glucose below 300 mg/dL over a 24 hour period, she should go to the ER for risk of DKA.

## 2023-08-02 ENCOUNTER — Other Ambulatory Visit: Payer: Self-pay

## 2023-08-02 ENCOUNTER — Other Ambulatory Visit (HOSPITAL_COMMUNITY): Payer: Self-pay

## 2023-08-07 ENCOUNTER — Other Ambulatory Visit (HOSPITAL_COMMUNITY): Payer: Self-pay

## 2023-08-09 ENCOUNTER — Other Ambulatory Visit (HOSPITAL_COMMUNITY): Payer: Self-pay

## 2023-08-26 DIAGNOSIS — L910 Hypertrophic scar: Secondary | ICD-10-CM | POA: Diagnosis not present

## 2023-08-27 ENCOUNTER — Encounter: Payer: Self-pay | Admitting: Nurse Practitioner

## 2023-08-27 ENCOUNTER — Other Ambulatory Visit: Payer: Self-pay

## 2023-08-27 ENCOUNTER — Other Ambulatory Visit (HOSPITAL_COMMUNITY): Payer: Self-pay

## 2023-08-27 ENCOUNTER — Ambulatory Visit: Admitting: Nurse Practitioner

## 2023-08-27 VITALS — BP 104/68 | HR 81 | Ht 61.0 in | Wt 143.0 lb

## 2023-08-27 DIAGNOSIS — Z794 Long term (current) use of insulin: Secondary | ICD-10-CM

## 2023-08-27 DIAGNOSIS — E109 Type 1 diabetes mellitus without complications: Secondary | ICD-10-CM

## 2023-08-27 LAB — POCT GLYCOSYLATED HEMOGLOBIN (HGB A1C): Hemoglobin A1C: 7.8 % — AB (ref 4.0–5.6)

## 2023-08-27 MED ORDER — INSULIN LISPRO 100 UNIT/ML IJ SOLN
60.0000 [IU] | Freq: Every day | INTRAMUSCULAR | 7 refills | Status: DC
  Filled 2023-08-27 – 2023-10-22 (×4): qty 40, 66d supply, fill #0
  Filled 2023-12-13: qty 40, 66d supply, fill #1
  Filled 2023-12-15: qty 10, 16d supply, fill #1

## 2023-08-27 MED ORDER — DEXCOM G7 SENSOR MISC
1.0000 | 3 refills | Status: AC
  Filled 2023-08-27 – 2023-10-05 (×2): qty 6, 60d supply, fill #0
  Filled 2023-12-13: qty 6, 60d supply, fill #1
  Filled 2024-01-31: qty 6, 60d supply, fill #2
  Filled 2024-02-01: qty 6, 60d supply, fill #0

## 2023-08-27 MED ORDER — OMNIPOD 5 DEXG7G6 PODS GEN 5 MISC
11 refills | Status: AC
  Filled 2023-08-27: qty 10, 20d supply, fill #0
  Filled 2023-09-28: qty 10, 20d supply, fill #1
  Filled 2023-11-02: qty 10, 20d supply, fill #2
  Filled 2023-11-19: qty 10, 20d supply, fill #3
  Filled 2023-12-30: qty 10, 20d supply, fill #4
  Filled 2024-01-25 – 2024-01-26 (×2): qty 10, 20d supply, fill #5
  Filled 2024-02-17: qty 45, 90d supply, fill #0

## 2023-08-27 NOTE — Progress Notes (Signed)
 Endocrinology Follow Up Note       08/27/2023, 10:58 AM    Subjective:    Patient ID: Alisha Church, female    DOB: [birth year].  Alisha Church is being seen in follow up after being seen in consultation for management of currently uncontrolled symptomatic diabetes requested by  Joshua Santana CROME, NP.   Past Medical History:  Diagnosis Date   Acne    Dehydration    Diabetes mellitus    Diabetes mellitus without complication (HCC)    Phreesia 01/10/2020   Ketonuria    Type 1 diabetes mellitus not at goal [facility])     Past Surgical History:  Procedure Laterality Date   PILONIDAL CYST / SINUS EXCISION      Social History   Socioeconomic History   Marital status: Single    Spouse name: Not on file   Number of children: Not on file   Years of education: Not on file   Highest education level: Not on file  Occupational History   Not on file  Tobacco Use   Smoking status: Never   Smokeless tobacco: Never  Substance and Sexual Activity   Alcohol use: No   Drug use: No   Sexual activity: Not Currently    Partners: Male    Birth control/protection: Abstinence    Comment: No hx of STIs  Other Topics Concern   Not on file  Social History Narrative   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 : Never true  Transportation Needs: No Transportation Needs (10/06/2022)   Received from [facility]    In the past 12 months, has lack of reliable transportation kept you from medical  appointments, meetings, work or from getting things needed for daily living? : No  Physical Activity: Not on file  Stress: Not on file  Social Connections: Not on file    Family History  Problem Relation Age of Onset   Thyroid  disease Mother    Diabetes Maternal Aunt    Heart disease Maternal Aunt    Hypothyroidism Maternal Grandmother    Thyroid  disease Maternal Grandmother    Heart disease Maternal Grandmother    Diabetes Cousin     Outpatient Encounter Medications as of 08/27/2023  Medication Sig   benzoyl peroxide (CERAVE ACNE FOAMING CREAM) 4 % external liquid Apply topically at bedtime.   clindamycin  (CLEOCIN  T) 1 % lotion Apply 1 Application topically 2 (two) times daily.   Cyanocobalamin  (VITAMIN B 12 PO) Take 2,500 mcg by mouth daily.   ferrous sulfate  325 (65 FE) MG tablet Take 1 tablet (325 mg total) by mouth daily with breakfast.   hydrOXYzine (ATARAX) 10  MG tablet Take 10 mg by mouth as needed.   Insulin  Disposable Pump (OMNIPOD 5 G6 INTRO, GEN 5,) KIT Change pod every 48-72 hours   Pediatric Multivitamins-Iron  (FLINTSTONES PLUS EXTRA IRON  PO) Take by mouth daily.   propranolol  (INDERAL ) 10 MG tablet Take 1 tablet (10 mg total) by mouth 2 (two) times a day as needed (anxiety).   tretinoin  (RETIN-A ) 0.05 % cream APPLY PEA SIZED AMOUNT ONTO AFFECTED AREA OF THE FACE IN THE EVENING   [DISCONTINUED] Continuous Blood Gluc Sensor (DEXCOM G7 SENSOR) MISC Inject 1 Application into the skin as directed. Change sensor every 10 days as directed.   [DISCONTINUED] Insulin  Disposable Pump (OMNIPOD 5 DEXG7G6 PODS GEN 5) MISC Change pod every 48-72 hours   [DISCONTINUED] insulin  lispro (HUMALOG ) 100 UNIT/ML injection Use with Omnipod for TDD around 60 units daily   Continuous Glucose Sensor (DEXCOM G7 SENSOR) MISC Inject 1 Application into the skin as directed. Change sensor every 10 days as directed.   Insulin  Disposable Pump (OMNIPOD 5 DEXG7G6 PODS GEN 5) MISC Change pod every 48-72  hours   insulin  lispro (HUMALOG ) 100 UNIT/ML injection Use with Omnipod for TDD around 60 units daily   Multiple Vitamin (MULTIVITAMIN WITH MINERALS) TABS tablet Take 1 tablet by mouth daily. (Patient not taking: Reported on 08/27/2023)   PARoxetine  (PAXIL ) 10 MG tablet Take 1 tablet (10 mg total) by mouth every morning. (Patient not taking: Reported on 08/27/2023)   Facility-Administered Encounter Medications as of 08/27/2023  Medication   iron  sucrose (VENOFER ) injection 200 mg    ALLERGIES: No Known Allergies  VACCINATION STATUS: Immunization History  Administered Date(s) Administered   DTaP 03/03/1999, 05/14/1999, 07/15/1999, 05/17/2000, 04/11/2004   H1N1 01/17/2008   HIB (PRP-OMP) 03/03/1999, 05/14/1999, 07/15/1999, 05/17/2000   HPV 9-valent 07/24/2010, 09/17/2011, 12/01/2012   HPV Quadrivalent 07/24/2010, 09/17/2011, 12/01/2012   Hepatitis A 11/12/2006, 06/13/2007   Hepatitis A, Adult 11/12/2006, 06/13/2007   Hepatitis A, Ped/Adol-2 Dose 11/12/2006, 06/13/2007   Hepatitis B 09/02/1998, 03/12/1999, 07/15/1999   Hepatitis B, PED/ADOLESCENT 08-Dec-1998, 03/12/1999, 07/15/1999   Hpv-Unspecified 07/24/2010, 09/17/2011   IPV 03/12/1999, 05/14/1999, 02/24/2000, 11/23/2000, 04/11/2004   Influenza Nasal 01/17/2008   Influenza Whole 12/09/2011   Influenza,inj,Quad PF,6+ Mos 11/02/2012, 12/24/2014, 02/08/2018   Influenza,inj,quad, With Preservative 02/26/2014   Influenza-Unspecified 01/17/2008, 11/02/2012, 02/26/2014, 12/24/2014, 11/05/2021   MMR 02/24/2000, 04/11/2004   Meningococcal Conjugate 07/24/2010, 05/13/2016, 05/13/2016   PFIZER Comirnaty(Gray Top)Covid-19 Tri-Sucrose Vaccine 10/09/2020, 11/10/2020   Pneumococcal Conjugate-13 03/03/1999, 05/14/1999, 07/15/1999, 02/24/2000   Pneumococcal Polysaccharide-23 03/03/1999, 05/14/1999   Pneumococcal-Unspecified 03/03/1999, 05/14/1999, 07/15/1999, 02/24/2000   Tdap 07/24/2010, 12/04/2021   Varicella 03/03/1999, 05/14/1999, 05/17/2000,  11/12/2006    Diabetes She presents for her follow-up diabetic visit. She has type 1 diabetes mellitus. Onset time: Diagnosed at approx age of 85. Her disease course has been fluctuating. There are no hypoglycemic associated symptoms. There are no diabetic associated symptoms. There are no hypoglycemic complications. There are no diabetic complications. Risk factors for coronary artery disease include diabetes mellitus. Current diabetic treatment includes insulin  pump. She is compliant with treatment most of the time. Her weight is stable. She is following a generally healthy diet. Meal planning includes avoidance of concentrated sweets. She has had a previous visit with a dietitian. She participates in exercise intermittently. Her home blood glucose trend is fluctuating dramatically. Her overall blood glucose range is >200 mg/dl. (She presents today with her CGM and Omnipod showing fluctuating glycemic profile.  Her POCT A1c today is 7.8% unchanged from last visit.  Analysis of her CGM shows TIR 46%, TAR 54%, TBR 0%.  She denies any significant hypoglycemia.  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 She does not see a podiatrist.Eye exam is current.    Review of systems  Constitutional: + Minimally fluctuating body weight,  current Body mass index is 27.02 kg/m. , no fatigue, no subjective hyperthermia, no subjective hypothermia Eyes: no blurry vision, no xerophthalmia ENT: no sore throat, no nodules palpated in throat, no dysphagia/odynophagia, no hoarseness Cardiovascular: no chest pain, no shortness of breath, no palpitations, no leg swelling Respiratory: no cough, no shortness of breath Gastrointestinal: no nausea/vomiting/diarrhea Musculoskeletal: no muscle/joint aches Skin: no rashes, no hyperemia Neurological: no tremors, no numbness, no tingling, no dizziness Psychiatric: no depression, no  anxiety  Objective:     BP 104/68 (BP Location: Left Arm, Patient Position: Sitting, Cuff Size: Large)   Pulse 81   Ht 5' 1 (1.549 m)   Wt 143 lb (64.9 kg)   BMI 27.02 kg/m   Wt Readings from Last 3 Encounters:  08/27/23 143 lb (64.9 kg)  06/16/23 142 lb (64.4 kg)  06/10/23 141 lb 3.2 oz (64 kg)     BP Readings from Last 3 Encounters:  08/27/23 104/68  06/16/23 116/74  06/10/23 114/78      Physical Exam- Limited  Constitutional:  Body mass index is 27.02 kg/m. , not in acute distress, normal state of mind Eyes:  EOMI, no exophthalmos Musculoskeletal: no gross deformities, strength intact in all four extremities, no gross restriction of joint movements Skin:  no rashes, no hyperemia Neurological: no tremor with outstretched hands   Diabetic Foot Exam - Simple   No data filed     CMP ( most recent) CMP     Component Value Date/Time   NA 137 04/16/2023 1039   K 3.8 04/16/2023 1039   CL 105 04/16/2023 1039   CO2 26 04/16/2023 1039   GLUCOSE 223 (H) 04/16/2023 1039   BUN 8 04/16/2023 1039   BUN 11 12/04/2021 0000   CREATININE 0.66 04/16/2023 1039   CREATININE 0.68 03/11/2021 0813   CALCIUM 9.3 04/16/2023 1039   PROT 7.7 04/16/2023 1039   ALBUMIN 4.3 04/16/2023 1039   AST 10 (L) 04/16/2023 1039   ALT 9 04/16/2023 1039   ALKPHOS 105 04/16/2023 1039   BILITOT 0.6 04/16/2023 1039   GFRNONAA >60 04/16/2023 1039   GFRAA NOT CALCULATED 07/19/2010 1509     Diabetic Labs (most recent): Lab Results  Component Value Date   HGBA1C 7.8 (A) 08/27/2023   HGBA1C 7.8 (A) 12/23/2022   HGBA1C 7.6 (A) 08/19/2022   MICROALBUR 10 mg/L 05/13/2022   MICROALBUR 0.4 03/11/2021   MICROALBUR 0.4 09/13/2019     Lipid Panel ( most recent) Lipid Panel     Component Value Date/Time   CHOL 138 03/11/2021 0813   TRIG 55 12/04/2021 0000   HDL 69 03/11/2021 0813   CHOLHDL 2.0 03/11/2021 0813   VLDL 12 04/04/2015 1433   LDLCALC 57 12/04/2021 0000   LDLCALC 59 03/11/2021  0813      Lab Results  Component Value Date   TSH 2.39 09/17/2021   TSH 3.06 03/11/2021   TSH 1.39 09/13/2019   TSH 1.28 10/25/2018   TSH 1.76 06/17/2017   TSH 1.10 04/04/2015   TSH 2.191 01/18/2014   TSH 2.850 10/31/2012   TSH 2.261 12/18/2011   TSH 2.253  10/08/2010   FREET4 0.9 09/17/2021   FREET4 1.0 03/11/2021   FREET4 0.9 09/13/2019   FREET4 1.0 10/25/2018   FREET4 1.2 06/17/2017   FREET4 1.2 04/04/2015   FREET4 1.09 01/18/2014   FREET4 1.07 10/31/2012   FREET4 1.11 12/18/2011   FREET4 1.19 10/08/2010           Assessment & Plan:   1) Type 1 diabetes mellitus without complication (HCC)  She presents today with her CGM and Omnipod showing fluctuating glycemic profile.  Her POCT A1c today is 7.8% unchanged from last visit.  Analysis of her CGM shows TIR 46%, TAR 54%, TBR 0%.  She denies any significant hypoglycemia.  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  - Fatiha Guzy has currently uncontrolled symptomatic type 1 DM since 23 years of age.   -Recent labs reviewed.  - I had a long discussion with her about the progressive nature of diabetes and the pathology behind its complications. -her diabetes is not currently complicated but she remains at a high risk for more acute and chronic complications which include CAD, CVA, CKD, retinopathy, and neuropathy. These are all discussed in detail with her.  The following Lifestyle Medicine recommendations according to American College of Lifestyle Medicine [facility]) were discussed and offered to patient and she agrees to start the journey:  A. Whole Foods, Plant-based plate comprising of fruits and vegetables, plant-based proteins, whole-grain carbohydrates was discussed in detail with the patient.   A list for source of those nutrients were also provided to the patient.  Patient will use only water or unsweetened tea for hydration. B.  The need to stay away from risky substances including  alcohol, smoking; obtaining 7 to 9 hours of restorative sleep, at least 150 minutes of moderate intensity exercise weekly, the importance of healthy social connections,  and stress reduction techniques were discussed. C.  A full color page of  Calorie density of various food groups per pound showing examples of each food groups was provided to the patient.  - Nutritional counseling repeated at each appointment due to patients tendency to fall back in to old habits.  - The patient admits there is a room for improvement in their diet and drink choices. -  Suggestion is made for the patient to avoid simple carbohydrates from their diet including Cakes, Sweet Desserts / Pastries, Ice Cream, Soda (diet and regular), Sweet Tea, Candies, Chips, Cookies, Sweet Pastries, Store Bought Juices, Alcohol in Excess of 1-2 drinks a day, Artificial Sweeteners, Coffee Creamer, and Sugar-free Products. This will help patient to have stable blood glucose profile and potentially avoid unintended weight gain.   - I encouraged the patient to switch to unprocessed or minimally processed complex starch and increased protein intake (animal or plant source), fruits, and vegetables.   - Patient is advised to stick to a routine mealtimes to eat 3 meals a day and avoid unnecessary snacks (to snack only to correct hypoglycemia).  - I have approached her with the following individualized plan to manage her diabetes and patient agrees:   -I did increase her max basal rate to 6 units per hour and adjust her correction factor to 35 to try and bring down postprandial spikes.  -she is encouraged to continue monitoring glucose 4 times daily (using her CGM), before meals and before bed.   - she is warned not to take insulin  without proper monitoring per orders. - Adjustment parameters are given to her for hypo  and hyperglycemia in writing. - she is encouraged to call clinic for blood glucose levels less than 70 or above 300 mg  /dl.  -Due to her type 1 diagnosis, she is not a candidate for non-insulin  therapies.  - Specific targets for  A1c; LDL, HDL, and Triglycerides were discussed with the patient.  2) Blood Pressure /Hypertension:  her blood pressure is controlled to target without the use of antihypertensive medications.     3) Lipids/Hyperlipidemia:    Review of her recent lipid panel from 12/04/21 showed controlled LDL at 57.  She is not currently on any lipid lowering medications.   Will recheck lipid panel prior to next visit.  4)  Weight/Diet:  her Body mass index is 27.02 kg/m.  -   she is NOT a candidate for weight loss.  Exercise, and detailed carbohydrates information provided  -  detailed on discharge instructions.  5) Chronic Care/Health Maintenance: -she is not on ACEI/ARB or Statin medications and is encouraged to initiate and continue to follow up with Ophthalmology, Dentist, Podiatrist at least yearly or according to recommendations, and advised to stay away from smoking. I have recommended yearly flu vaccine and pneumonia vaccine at least every 5 years; moderate intensity exercise for up to 150 minutes weekly; and sleep for at least 7 hours a day.  - she is advised to maintain close follow up with Joshua Santana CROME, NP for primary care needs, as well as her other providers for optimal and coordinated care.     I spent  31  minutes in the care of the patient today including review of labs from CMP, Lipids, Thyroid  Function, Hematology (current and previous including abstractions from other facilities); face-to-face time discussing  her blood glucose readings/logs, discussing hypoglycemia and hyperglycemia episodes and symptoms, medications doses, her options of short and long term treatment based on the latest standards of care / guidelines;  discussion about incorporating lifestyle medicine;  and documenting the encounter. Risk reduction counseling performed per USPSTF guidelines to reduce obesity  and cardiovascular risk factors.     Please refer to Patient Instructions for Blood Glucose Monitoring and Insulin /Medications Dosing Guide  in media tab for additional information. Please  also refer to  Patient Self Inventory in the Media  tab for reviewed elements of pertinent patient history.  Alisha Church participated in the discussions, expressed understanding, and voiced agreement with the above plans.  All questions were answered to her satisfaction. she is encouraged to contact clinic should she have any questions or concerns prior to her return visit.     Follow up plan: - Return in about 4 months (around 12/27/2023) for Diabetes F/U with A1c in office, Bring meter and logs, Previsit labs.  Benton Rio, [facility] [address] [place], [place] [address] Phone: [phone] Fax: [phone]  08/27/2023, 10:58 AM

## 2023-09-02 ENCOUNTER — Other Ambulatory Visit (HOSPITAL_COMMUNITY): Payer: Self-pay

## 2023-09-29 ENCOUNTER — Other Ambulatory Visit: Payer: Self-pay

## 2023-09-29 ENCOUNTER — Other Ambulatory Visit (HOSPITAL_COMMUNITY): Payer: Self-pay

## 2023-10-05 ENCOUNTER — Encounter: Payer: Self-pay | Admitting: Physician Assistant

## 2023-10-05 ENCOUNTER — Other Ambulatory Visit (HOSPITAL_COMMUNITY): Payer: Self-pay

## 2023-10-07 ENCOUNTER — Other Ambulatory Visit (HOSPITAL_COMMUNITY): Payer: Self-pay

## 2023-10-18 ENCOUNTER — Other Ambulatory Visit (HOSPITAL_COMMUNITY): Payer: Self-pay

## 2023-10-18 ENCOUNTER — Encounter: Payer: Self-pay | Admitting: Nurse Practitioner

## 2023-10-19 DIAGNOSIS — Z794 Long term (current) use of insulin: Secondary | ICD-10-CM | POA: Diagnosis not present

## 2023-10-19 DIAGNOSIS — E109 Type 1 diabetes mellitus without complications: Secondary | ICD-10-CM | POA: Diagnosis not present

## 2023-10-20 ENCOUNTER — Other Ambulatory Visit (HOSPITAL_COMMUNITY): Payer: Self-pay

## 2023-10-20 ENCOUNTER — Ambulatory Visit: Payer: Self-pay | Admitting: Nurse Practitioner

## 2023-10-20 DIAGNOSIS — E559 Vitamin D deficiency, unspecified: Secondary | ICD-10-CM

## 2023-10-20 LAB — COMPREHENSIVE METABOLIC PANEL WITH GFR
ALT: 8 IU/L (ref 0–32)
AST: 9 IU/L (ref 0–40)
Albumin: 4.5 g/dL (ref 4.0–5.0)
Alkaline Phosphatase: 124 IU/L — ABNORMAL HIGH (ref 41–116)
BUN/Creatinine Ratio: 12 (ref 9–23)
BUN: 8 mg/dL (ref 6–20)
Bilirubin Total: 0.3 mg/dL (ref 0.0–1.2)
CO2: 21 mmol/L (ref 20–29)
Calcium: 9.3 mg/dL (ref 8.7–10.2)
Chloride: 105 mmol/L (ref 96–106)
Creatinine, Ser: 0.68 mg/dL (ref 0.57–1.00)
Globulin, Total: 3 g/dL (ref 1.5–4.5)
Glucose: 58 mg/dL — ABNORMAL LOW (ref 70–99)
Potassium: 4.3 mmol/L (ref 3.5–5.2)
Sodium: 141 mmol/L (ref 134–144)
Total Protein: 7.5 g/dL (ref 6.0–8.5)
eGFR: 125 mL/min/1.73 (ref >59)

## 2023-10-20 LAB — T4, FREE: Free T4: 1.12 ng/dL (ref 0.82–1.77)

## 2023-10-20 LAB — LIPID PANEL
Chol/HDL Ratio: 2.2 ratio (ref 0.0–4.4)
Cholesterol, Total: 120 mg/dL (ref 100–199)
HDL: 54 mg/dL (ref >39)
LDL Chol Calc (NIH): 57 mg/dL (ref 0–99)
Triglycerides: 28 mg/dL (ref 0–149)
VLDL Cholesterol Cal: 9 mg/dL (ref 5–40)

## 2023-10-20 LAB — TSH: TSH: 1.86 u[IU]/mL (ref 0.450–4.500)

## 2023-10-20 LAB — VITAMIN D 25 HYDROXY (VIT D DEFICIENCY, FRACTURES): Vit D, 25-Hydroxy: 14.3 ng/mL — ABNORMAL LOW (ref 30.0–100.0)

## 2023-10-20 MED ORDER — VITAMIN D (ERGOCALCIFEROL) 1.25 MG (50000 UNIT) PO CAPS
50000.0000 [IU] | ORAL_CAPSULE | ORAL | 1 refills | Status: AC
  Filled 2023-10-20: qty 12, 84d supply, fill #0

## 2023-10-20 NOTE — Progress Notes (Signed)
 FYI: I sent mychart message going over lab results.

## 2023-10-20 NOTE — Progress Notes (Signed)
 noted

## 2023-10-21 ENCOUNTER — Other Ambulatory Visit: Payer: Self-pay | Admitting: Physician Assistant

## 2023-10-21 DIAGNOSIS — E538 Deficiency of other specified B group vitamins: Secondary | ICD-10-CM

## 2023-10-21 DIAGNOSIS — D500 Iron deficiency anemia secondary to blood loss (chronic): Secondary | ICD-10-CM

## 2023-10-21 NOTE — Progress Notes (Unsigned)
 [facility] [phone]   [phone]) [phone]  PROGRESS NOTE  Patient Care Team: Joshua Santana CROME, NP as PCP - General (Nurse Practitioner) Bernie Guillermina BROCKS, MD (Inactive) as Consulting Physician (Hematology) Ginette Shasta NOVAK, NP as Nurse Practitioner (Radiology)  CHIEF COMPLAINTS/PURPOSE OF CONSULTATION:  Iron  deficiency anemia B12 deficiency  HISTORY OF PRESENTING ILLNESS:  Alisha Church is a 23 y.o. female who returns for a follow up for iron  and B12 deficiency.   Alisha Church reports she has improved energy levels after receiving IV iron .  She continues to stay compliant taking iron  pills and B12 supplement.  Her last menstrual cycle was a little heavier this past month lasting 3 days with very heavy bleeding.  She denies nausea, vomiting or bowel habit changes.  She denies any dietary changes or restrictions.  She denies fevers, chills, night sweats, shortness of breath, chest pain or cough.  She has no other complaints.  Rest of the 10 point ROS as below.  MEDICAL HISTORY:  Past Medical History:  Diagnosis Date   Acne    Dehydration    Diabetes mellitus    Diabetes mellitus without complication (HCC)    Phreesia 01/10/2020   Ketonuria    Type 1 diabetes mellitus not at goal [facility])     SURGICAL HISTORY: Past Surgical History:  Procedure Laterality Date   PILONIDAL CYST / SINUS EXCISION      SOCIAL HISTORY: Social History   Socioeconomic History   Marital status: Single    Spouse name: Not on file   Number of children: Not on file   Years of education: Not on file   Highest education level: Not on file  Occupational History   Not on file  Tobacco Use   Smoking status: Never   Smokeless tobacco: Never  Substance and Sexual Activity   Alcohol use: No   Drug use: No   Sexual activity: Not Currently    Partners: Male    Birth control/protection: Abstinence    Comment: No hx of STIs  Other Topics Concern   Not on file  Social  History Narrative   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 : Never true  Transportation Needs: No Transportation Needs (10/06/2022)   Received from [facility]    In the past 12 months, has lack of reliable transportation kept you from medical appointments, meetings, work or from getting things needed for daily living? : No  Physical Activity: Not on file  Stress: Not on file  Social Connections: Not on file  Intimate Partner Violence: Not on file    FAMILY HISTORY: Family History  Problem Relation Age of Onset   Thyroid  disease Mother    Diabetes Maternal Aunt    Heart disease Maternal Aunt    Hypothyroidism Maternal Grandmother    Thyroid  disease Maternal Grandmother    Heart disease Maternal Grandmother    Diabetes Cousin     ALLERGIES:  has no known  allergies.  MEDICATIONS:  Current Outpatient Medications  Medication Sig Dispense Refill   benzoyl peroxide (CERAVE ACNE FOAMING CREAM) 4 % external liquid Apply topically at bedtime.     clindamycin  (CLEOCIN  T) 1 % lotion Apply 1 Application topically 2 (two) times daily.     Continuous Glucose Sensor (DEXCOM G7 SENSOR) MISC Apply 1 sensor into the skin as directed. Change sensor every 10 days as directed. 6 each 3   Cyanocobalamin  (VITAMIN B 12 PO) Take 2,500 mcg by mouth daily.     ferrous sulfate  325 (65 FE) MG tablet Take 1 tablet (325 mg total) by mouth daily with breakfast. 30 tablet 6   hydrOXYzine (ATARAX) 10 MG tablet Take 10 mg by mouth as needed.     Insulin  Disposable Pump (OMNIPOD  5 DEXG7G6 PODS GEN 5) MISC Change pod every 48-72 hours 10 each 11   Insulin  Disposable Pump (OMNIPOD 5 G6 INTRO, GEN 5,) KIT Change pod every 48-72 hours 1 kit 0   insulin  lispro (HUMALOG ) 100 UNIT/ML injection Use with Omnipod for TDD around 60 units daily 40 mL 7   Multiple Vitamin (MULTIVITAMIN WITH MINERALS) TABS tablet Take 1 tablet by mouth daily. (Patient not taking: Reported on 08/27/2023)     PARoxetine  (PAXIL ) 10 MG tablet Take 1 tablet (10 mg total) by mouth every morning. (Patient not taking: Reported on 08/27/2023) 90 tablet 3   Pediatric Multivitamins-Iron  (FLINTSTONES PLUS EXTRA IRON  PO) Take by mouth daily.     propranolol  (INDERAL ) 10 MG tablet Take 1 tablet (10 mg total) by mouth 2 (two) times a day as needed (anxiety). 30 tablet 0   tretinoin  (RETIN-A ) 0.05 % cream APPLY PEA SIZED AMOUNT ONTO AFFECTED AREA OF THE FACE IN THE EVENING 45 g 2   Vitamin D , Ergocalciferol , (DRISDOL ) 1.25 MG (50000 UNIT) CAPS capsule Take 1 capsule (50,000 Units total) by mouth every 7 (seven) days. 12 capsule 1   No current facility-administered medications for this visit.   Facility-Administered Medications Ordered in Other Visits  Medication Dose Route Frequency Provider Last Rate Last Admin   iron  sucrose (VENOFER ) injection 200 mg  200 mg Intravenous Once Merrell Borsuk T, PA-C        REVIEW OF SYSTEMS:   Constitutional: ( - ) fevers, ( - )  chills , ( - ) night sweats Eyes: ( - ) blurriness of vision, ( - ) double vision, ( - ) watery eyes Ears, nose, mouth, throat, and face: ( - ) mucositis, ( - ) sore throat Respiratory: ( - ) cough, ( - ) dyspnea, ( - ) wheezes Cardiovascular: ( - ) palpitation, ( - ) chest discomfort, ( - ) lower extremity swelling Gastrointestinal:  ( - ) nausea, ( - ) heartburn, ( - ) change in bowel habits Skin: ( - ) abnormal skin rashes Lymphatics: ( - ) new lymphadenopathy, ( - ) easy bruising Neurological: ( - ) numbness, ( - ) tingling, ( - ) new  weaknesses Behavioral/Psych: ( - ) mood change, ( - ) new changes  All other systems were reviewed with the patient and are negative.  PHYSICAL EXAMINATION: ECOG PERFORMANCE STATUS: 0 - Asymptomatic  Vitals:   10/22/23 1124  BP: 114/80  Pulse: 69  Resp: 13  Temp: (!) 97.3 F (36.3 C)  SpO2: 100%    Filed Weights   10/22/23 1124  Weight: 144 lb 8 oz (65.5 kg)     GENERAL: well appearing female in NAD  SKIN: skin  color, texture, turgor are normal, no rashes or significant lesions EYES: conjunctiva are pink and non-injected, sclera clear LUNGS: clear to auscultation and percussion with normal breathing effort HEART: regular rate & rhythm and no murmurs and no lower extremity edema Musculoskeletal: no cyanosis of digits and no clubbing  PSYCH: alert & oriented x 3, fluent speech NEURO: no focal motor/sensory deficits  LABORATORY DATA:  I have reviewed the data as listed    Latest Ref Rng & Units 10/22/2023   11:01 AM 04/16/2023   10:39 AM 02/02/2023   11:45 AM  CBC  WBC 4.0 - 10.5 K/uL 5.8  5.9  6.2   Hemoglobin 12.0 - 15.0 g/dL 88.3  88.6  88.0   Hematocrit 36.0 - 46.0 % 35.1  34.3  35.4   Platelets 150 - 400 K/uL 287  283  250        Latest Ref Rng & Units 10/19/2023   11:13 AM 04/16/2023   10:39 AM 06/19/2022    9:22 AM  CMP  Glucose 70 - 99 mg/dL 58  776  825   BUN 6 - 20 mg/dL 8  8  12    Creatinine 0.57 - 1.00 mg/dL 9.31  9.33  9.34   Sodium 134 - 144 mmol/L 141  137  138   Potassium 3.5 - 5.2 mmol/L 4.3  3.8  3.9   Chloride 96 - 106 mmol/L 105  105  105   CO2 20 - 29 mmol/L 21  26  26    Calcium 8.7 - 10.2 mg/dL 9.3  9.3  9.2   Total Protein 6.0 - 8.5 g/dL 7.5  7.7  7.3   Total Bilirubin 0.0 - 1.2 mg/dL 0.3  0.6  0.7   Alkaline Phos 41 - 116 IU/L 124  105  94   AST 0 - 40 IU/L 9  10  11    ALT 0 - 32 IU/L 8  9  7      ASSESSMENT & PLAN Jaeli Grubb is a 23 y.o. female  who returns for a follow up for iron  deficiency anemia and B12 deficiency.  Assessment  & Plan #Iron  deficiency anemia 2/2 menorrhagia: - Encouraged to follow up with OB/GYN to help address her heavy cycles.  - Patient has sickle cell trait which can contribute to her anemia.  - Last received IV venofer  200 mg x 3 doses from 06/03/23-06/16/23. - Labs today show mild anemia with Hgb 11.6, MCV 75.3. Iron  and B12 levels are pending - Will arange for IV iron  or B12 injections based on pending labs.  - Continue daily Flintstone iron  supplements. - Continue daily dissolvable B12 supplement.  Follow-up - Schedule lab-only visit in 3 months. - Schedule follow-up visit in 6 months.   Orders Placed This Encounter  Procedures   CBC with Differential (Cancer Center Only)    Standing Status:   Future    Expected Date:   01/21/2024    Expiration Date:   04/20/2024   CMP (Cancer Center only)    Standing Status:   Future    Expected Date:   01/21/2024    Expiration Date:   04/20/2024   Ferritin    Standing Status:   Future    Expected Date:   01/21/2024    Expiration Date:   04/20/2024   Iron  and Iron  Binding Capacity ([facility] only)    Standing Status:   Future    Expected Date:   01/21/2024    Expiration Date:   04/20/2024  Vitamin B12    Standing Status:   Future    Expected Date:   01/21/2024    Expiration Date:   04/20/2024    All questions were answered. The patient knows to call the clinic with any problems, questions or concerns.  I have spent a total of 25 minutes minutes of face-to-face and non-face-to-face time, preparing to see the patient, performing a medically appropriate examination, counseling and educating the patient, documenting clinical information in the electronic health record, independently interpreting results and communicating results to the patient, and care coordination.   Johnston Police, PA-C Department of Hematology/Oncology [facility] Phone: [phone]

## 2023-10-22 ENCOUNTER — Other Ambulatory Visit (HOSPITAL_COMMUNITY): Payer: Self-pay

## 2023-10-22 ENCOUNTER — Inpatient Hospital Stay: Admitting: Physician Assistant

## 2023-10-22 ENCOUNTER — Inpatient Hospital Stay: Attending: Physician Assistant

## 2023-10-22 VITALS — BP 114/80 | HR 69 | Temp 97.3°F | Resp 13 | Wt 144.5 lb

## 2023-10-22 DIAGNOSIS — Z23 Encounter for immunization: Secondary | ICD-10-CM | POA: Diagnosis not present

## 2023-10-22 DIAGNOSIS — N920 Excessive and frequent menstruation with regular cycle: Secondary | ICD-10-CM | POA: Insufficient documentation

## 2023-10-22 DIAGNOSIS — D500 Iron deficiency anemia secondary to blood loss (chronic): Secondary | ICD-10-CM

## 2023-10-22 DIAGNOSIS — E538 Deficiency of other specified B group vitamins: Secondary | ICD-10-CM

## 2023-10-22 DIAGNOSIS — D573 Sickle-cell trait: Secondary | ICD-10-CM | POA: Insufficient documentation

## 2023-10-22 LAB — CMP (CANCER CENTER ONLY)
ALT: 8 U/L (ref 0–44)
AST: 10 U/L — ABNORMAL LOW (ref 15–41)
Albumin: 4.2 g/dL (ref 3.5–5.0)
Alkaline Phosphatase: 100 U/L (ref 38–126)
Anion gap: 4 — ABNORMAL LOW (ref 5–15)
BUN: 8 mg/dL (ref 6–20)
CO2: 26 mmol/L (ref 22–32)
Calcium: 8.9 mg/dL (ref 8.9–10.3)
Chloride: 107 mmol/L (ref 98–111)
Creatinine: 0.64 mg/dL (ref 0.44–1.00)
GFR, Estimated: >60 mL/min (ref >60)
Glucose, Bld: 112 mg/dL — ABNORMAL HIGH (ref 70–99)
Potassium: 3.9 mmol/L (ref 3.5–5.1)
Sodium: 137 mmol/L (ref 135–145)
Total Bilirubin: 0.4 mg/dL (ref 0.0–1.2)
Total Protein: 7.2 g/dL (ref 6.5–8.1)

## 2023-10-22 LAB — IRON AND IRON BINDING CAPACITY (CC-WL,HP ONLY)
Iron: 76 ug/dL (ref 28–170)
Saturation Ratios: 21 % (ref 10.4–31.8)
TIBC: 364 ug/dL (ref 250–450)
UIBC: 288 ug/dL (ref 148–442)

## 2023-10-22 LAB — CBC WITH DIFFERENTIAL (CANCER CENTER ONLY)
Abs Immature Granulocytes: 0.01 K/uL (ref 0.00–0.07)
Basophils Absolute: 0 K/uL (ref 0.0–0.1)
Basophils Relative: 1 %
Eosinophils Absolute: 0.1 K/uL (ref 0.0–0.5)
Eosinophils Relative: 1 %
HCT: 35.1 % — ABNORMAL LOW (ref 36.0–46.0)
Hemoglobin: 11.6 g/dL — ABNORMAL LOW (ref 12.0–15.0)
Immature Granulocytes: 0 %
Lymphocytes Relative: 27 %
Lymphs Abs: 1.6 K/uL (ref 0.7–4.0)
MCH: 24.9 pg — ABNORMAL LOW (ref 26.0–34.0)
MCHC: 33 g/dL (ref 30.0–36.0)
MCV: 75.3 fL — ABNORMAL LOW (ref 80.0–100.0)
Monocytes Absolute: 0.4 K/uL (ref 0.1–1.0)
Monocytes Relative: 7 %
Neutro Abs: 3.7 K/uL (ref 1.7–7.7)
Neutrophils Relative %: 64 %
Platelet Count: 287 K/uL (ref 150–400)
RBC: 4.66 MIL/uL (ref 3.87–5.11)
RDW: 13.2 % (ref 11.5–15.5)
WBC Count: 5.8 K/uL (ref 4.0–10.5)
nRBC: 0 % (ref 0.0–0.2)

## 2023-10-22 LAB — FERRITIN: Ferritin: 53 ng/mL (ref 11–307)

## 2023-10-22 LAB — VITAMIN B12: Vitamin B-12: 444 pg/mL (ref 180–914)

## 2023-10-25 ENCOUNTER — Telehealth: Payer: Self-pay | Admitting: Physician Assistant

## 2023-10-25 NOTE — Telephone Encounter (Signed)
 Scheduled appointments for patient per 10/22/2023 los notes. Left voicemail and mailed reminders to address on file.

## 2023-10-26 ENCOUNTER — Ambulatory Visit: Payer: Self-pay | Admitting: Physician Assistant

## 2023-11-02 ENCOUNTER — Other Ambulatory Visit (HOSPITAL_COMMUNITY): Payer: Self-pay

## 2023-11-15 DIAGNOSIS — Z111 Encounter for screening for respiratory tuberculosis: Secondary | ICD-10-CM | POA: Diagnosis not present

## 2023-11-19 ENCOUNTER — Other Ambulatory Visit (HOSPITAL_COMMUNITY): Payer: Self-pay

## 2023-11-30 ENCOUNTER — Other Ambulatory Visit (HOSPITAL_COMMUNITY): Payer: Self-pay

## 2023-11-30 ENCOUNTER — Encounter: Payer: Self-pay | Admitting: Nurse Practitioner

## 2023-12-13 ENCOUNTER — Other Ambulatory Visit (HOSPITAL_COMMUNITY): Payer: Self-pay

## 2023-12-13 ENCOUNTER — Other Ambulatory Visit: Payer: Self-pay

## 2023-12-13 DIAGNOSIS — L0501 Pilonidal cyst with abscess: Secondary | ICD-10-CM | POA: Diagnosis not present

## 2023-12-15 ENCOUNTER — Encounter: Payer: Self-pay | Admitting: Physician Assistant

## 2023-12-15 ENCOUNTER — Other Ambulatory Visit (HOSPITAL_COMMUNITY): Payer: Self-pay

## 2023-12-29 ENCOUNTER — Ambulatory Visit: Admitting: Nurse Practitioner

## 2023-12-29 ENCOUNTER — Encounter: Payer: Self-pay | Admitting: Nurse Practitioner

## 2023-12-29 ENCOUNTER — Other Ambulatory Visit (HOSPITAL_COMMUNITY): Payer: Self-pay

## 2023-12-29 VITALS — BP 104/60 | HR 68 | Ht 61.0 in | Wt 145.2 lb

## 2023-12-29 DIAGNOSIS — Z794 Long term (current) use of insulin: Secondary | ICD-10-CM | POA: Diagnosis not present

## 2023-12-29 DIAGNOSIS — E559 Vitamin D deficiency, unspecified: Secondary | ICD-10-CM | POA: Diagnosis not present

## 2023-12-29 DIAGNOSIS — E109 Type 1 diabetes mellitus without complications: Secondary | ICD-10-CM | POA: Diagnosis not present

## 2023-12-29 LAB — POCT UA - MICROALBUMIN

## 2023-12-29 LAB — POCT GLYCOSYLATED HEMOGLOBIN (HGB A1C): Hemoglobin A1C: 7.8 % — AB (ref 4.0–5.6)

## 2023-12-29 MED ORDER — INSULIN LISPRO 100 UNIT/ML IJ SOLN
100.0000 [IU] | Freq: Every day | INTRAMUSCULAR | 3 refills | Status: AC
  Filled 2023-12-29: qty 80, 80d supply, fill #0

## 2023-12-29 NOTE — Progress Notes (Signed)
 Endocrinology Follow Up Note       12/29/2023, 3:00 PM    Subjective:    Patient ID: Alisha Church, female    DOB: [birth year].  Alisha Church is being seen in follow up after being seen in consultation for management of currently uncontrolled symptomatic diabetes requested by  Joshua Santana CROME, NP.   Past Medical History:  Diagnosis Date   Acne    Dehydration    Diabetes mellitus    Diabetes mellitus without complication (HCC)    Phreesia 01/10/2020   Ketonuria    Type 1 diabetes mellitus not at goal [facility])     Past Surgical History:  Procedure Laterality Date   PILONIDAL CYST / SINUS EXCISION      Social History   Socioeconomic History   Marital status: Single    Spouse name: Not on file   Number of children: Not on file   Years of education: Not on file   Highest education level: Not on file  Occupational History   Not on file  Tobacco Use   Smoking status: Never   Smokeless tobacco: Never  Substance and Sexual Activity   Alcohol use: No   Drug use: No   Sexual activity: Not Currently    Partners: Male    Birth control/protection: Abstinence    Comment: No hx of STIs  Other Topics Concern   Not on file  Social History Narrative   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 : Never true  Transportation Needs: No Transportation Needs (10/06/2022)   Received from [facility]    In the past 12 months, has lack of reliable transportation kept you from medical  appointments, meetings, work or from getting things needed for daily living? : No  Physical Activity: Not on file  Stress: Not on file  Social Connections: Not on file    Family History  Problem Relation Age of Onset   Thyroid  disease Mother    Diabetes Maternal Aunt    Heart disease Maternal Aunt    Hypothyroidism Maternal Grandmother    Thyroid  disease Maternal Grandmother    Heart disease Maternal Grandmother    Diabetes Cousin     Outpatient Encounter Medications as of 12/29/2023  Medication Sig   benzoyl peroxide (CERAVE ACNE FOAMING CREAM) 4 % external liquid Apply topically at bedtime.   clindamycin  (CLEOCIN  T) 1 % lotion Apply 1 Application topically 2 (two) times daily.   Continuous Glucose Sensor (DEXCOM G7 SENSOR) MISC Apply 1 sensor into the skin as directed. Change sensor every 10 days as directed.   Cyanocobalamin  (VITAMIN B 12 PO) Take 2,500 mcg by mouth daily.  ferrous sulfate  325 (65 FE) MG tablet Take 1 tablet (325 mg total) by mouth daily with breakfast.   hydrOXYzine (ATARAX) 10 MG tablet Take 10 mg by mouth as needed.   Insulin  Disposable Pump (OMNIPOD 5 [id number] PODS GEN 5) MISC Change pod every 48-72 hours   Pediatric Multivitamins-Iron  (FLINTSTONES PLUS EXTRA IRON  PO) Take by mouth daily.   propranolol  (INDERAL ) 10 MG tablet Take 1 tablet (10 mg total) by mouth 2 (two) times a day as needed (anxiety).   tretinoin  (RETIN-A ) 0.05 % cream APPLY PEA SIZED AMOUNT ONTO AFFECTED AREA OF THE FACE IN THE EVENING   Vitamin D , Ergocalciferol , (DRISDOL ) 1.25 MG (50000 UNIT) CAPS capsule Take 1 capsule (50,000 Units total) by mouth every 7 (seven) days.   insulin  lispro (HUMALOG ) 100 UNIT/ML injection Use with Omnipod for TDD around 100 units   Multiple Vitamin (MULTIVITAMIN WITH MINERALS) TABS tablet Take 1 tablet by mouth daily. (Patient not taking: Reported on 12/29/2023)   [DISCONTINUED] Insulin  Disposable Pump (OMNIPOD 5 G6 INTRO, GEN 5,) KIT Change pod every 48-72 hours  (Patient not taking: Reported on 12/29/2023)   [DISCONTINUED] insulin  lispro (HUMALOG ) 100 UNIT/ML injection Use with Omnipod for total daily dose of around 60 units daily   [DISCONTINUED] PARoxetine  (PAXIL ) 10 MG tablet Take 1 tablet (10 mg total) by mouth every morning. (Patient not taking: Reported on 12/29/2023)   Facility-Administered Encounter Medications as of 12/29/2023  Medication   iron  sucrose (VENOFER ) injection 200 mg    ALLERGIES: No Known Allergies  VACCINATION STATUS: Immunization History  Administered Date(s) Administered   DTaP 03/03/1999, 05/14/1999, 07/15/1999, 05/17/2000, 04/11/2004   H1N1 01/17/2008   HIB (PRP-OMP) 03/03/1999, 05/14/1999, 07/15/1999, 05/17/2000   HPV 9-valent 07/24/2010, 09/17/2011, 12/01/2012   HPV Quadrivalent 07/24/2010, 09/17/2011, 12/01/2012   Hepatitis A 11/12/2006, 06/13/2007   Hepatitis A, Adult 11/12/2006, 06/13/2007   Hepatitis A, Ped/Adol-2 Dose 11/12/2006, 06/13/2007   Hepatitis B 03-05-1998, 03/12/1999, 07/15/1999   Hepatitis B, PED/ADOLESCENT January 27, 1998, 03/12/1999, 07/15/1999   Hpv-Unspecified 07/24/2010, 09/17/2011   IPV 03/12/1999, 05/14/1999, 02/24/2000, 11/23/2000, 04/11/2004   Influenza Nasal 01/17/2008   Influenza Whole 12/09/2011   Influenza,inj,Quad PF,6+ Mos 11/02/2012, 12/24/2014, 02/08/2018   Influenza,inj,quad, With Preservative 02/26/2014   Influenza-Unspecified 01/17/2008, 11/02/2012, 02/26/2014, 12/24/2014, 11/05/2021   MMR 02/24/2000, 04/11/2004   Meningococcal Conjugate 07/24/2010, 05/13/2016, 05/13/2016   PFIZER Comirnaty(Gray Top)Covid-19 Tri-Sucrose Vaccine 10/09/2020, 11/10/2020   Pneumococcal Conjugate-13 03/03/1999, 05/14/1999, 07/15/1999, 02/24/2000   Pneumococcal Polysaccharide-23 03/03/1999, 05/14/1999   Pneumococcal-Unspecified 03/03/1999, 05/14/1999, 07/15/1999, 02/24/2000   Tdap 07/24/2010, 12/04/2021   Varicella 03/03/1999, 05/14/1999, 05/17/2000, 11/12/2006    Diabetes She presents for her  follow-up diabetic visit. She has type 1 diabetes mellitus. Onset time: Diagnosed at approx age of 58. Her disease course has been improving. There are no hypoglycemic associated symptoms. There are no diabetic associated symptoms. There are no hypoglycemic complications. There are no diabetic complications. Risk factors for coronary artery disease include diabetes mellitus. Current diabetic treatment includes insulin  pump. She is compliant with treatment most of the time. Her weight is stable. She is following a generally healthy diet. Meal planning includes avoidance of concentrated sweets. She has had a previous visit with a dietitian. She participates in exercise intermittently. Her home blood glucose trend is decreasing steadily. Her overall blood glucose range is 140-180 mg/dl. (She presents today with her CGM and Omnipod showing fluctuating but overall improving glycemic profile.  Her POCT A1c today is 7.8%, unchanged from last visit.  Analysis of her CGM shows TIR 57%, TAR  39%, TBR 4% with a GMI of 7.3%.  She denies any significant hypoglycemia.  She did have pilonidal cyst drained between visits.  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 She does not see a podiatrist.Eye exam is current.    Review of systems  Constitutional: + Minimally fluctuating body weight,  current Body mass index is 27.44 kg/m. , no fatigue, no subjective hyperthermia, no subjective hypothermia Eyes: no blurry vision, no xerophthalmia ENT: no sore throat, no nodules palpated in throat, no dysphagia/odynophagia, no hoarseness Cardiovascular: no chest pain, no shortness of breath, no palpitations, no leg swelling Respiratory: no cough, no shortness of breath Gastrointestinal: no nausea/vomiting/diarrhea Musculoskeletal: no muscle/joint aches Skin: no rashes, no hyperemia Neurological: no tremors, no numbness, no tingling, no  dizziness Psychiatric: no depression, no anxiety  Objective:     BP 104/60 (BP Location: Left Arm, Patient Position: Sitting, Cuff Size: Large)   Pulse 68   Ht 5' 1 (1.549 m)   Wt 145 lb 3.2 oz (65.9 kg)   BMI 27.44 kg/m   Wt Readings from Last 3 Encounters:  12/29/23 145 lb 3.2 oz (65.9 kg)  10/22/23 144 lb 8 oz (65.5 kg)  08/27/23 143 lb (64.9 kg)     BP Readings from Last 3 Encounters:  12/29/23 104/60  10/22/23 114/80  08/27/23 104/68      Physical Exam- Limited  Constitutional:  Body mass index is 27.44 kg/m. , not in acute distress, normal state of mind Eyes:  EOMI, no exophthalmos Musculoskeletal: no gross deformities, strength intact in all four extremities, no gross restriction of joint movements Skin:  no rashes, no hyperemia Neurological: no tremor with outstretched hands   Diabetic Foot Exam - Simple   Simple Foot Form Visual Inspection No deformities, no ulcerations, no other skin breakdown bilaterally: Yes Sensation Testing Intact to touch and monofilament testing bilaterally: Yes Pulse Check Posterior Tibialis and Dorsalis pulse intact bilaterally: Yes Comments     CMP ( most recent) CMP     Component Value Date/Time   NA 137 10/22/2023 1101   NA 141 10/19/2023 1113   K 3.9 10/22/2023 1101   CL 107 10/22/2023 1101   CO2 26 10/22/2023 1101   GLUCOSE 112 (H) 10/22/2023 1101   BUN 8 10/22/2023 1101   BUN 8 10/19/2023 1113   CREATININE 0.64 10/22/2023 1101   CREATININE 0.68 03/11/2021 0813   CALCIUM 8.9 10/22/2023 1101   PROT 7.2 10/22/2023 1101   PROT 7.5 10/19/2023 1113   ALBUMIN 4.2 10/22/2023 1101   ALBUMIN 4.5 10/19/2023 1113   AST 10 (L) 10/22/2023 1101   ALT 8 10/22/2023 1101   ALKPHOS 100 10/22/2023 1101   BILITOT 0.4 10/22/2023 1101   GFRNONAA >60 10/22/2023 1101   GFRAA NOT CALCULATED 07/19/2010 1509     Diabetic Labs (most recent): Lab Results  Component Value Date   HGBA1C 7.8 (A) 12/29/2023   HGBA1C 7.8 (A)  08/27/2023   HGBA1C 7.8 (A) 12/23/2022   MICROALBUR 30mg /L 12/29/2023   MICROALBUR 10 mg/L 05/13/2022   MICROALBUR 0.4 03/11/2021     Lipid Panel ( most recent) Lipid Panel     Component Value Date/Time   CHOL 120 10/19/2023 1113   TRIG 28 10/19/2023 1113   HDL 54 10/19/2023 1113   CHOLHDL 2.2 10/19/2023 1113   CHOLHDL 2.0 03/11/2021 0813   VLDL 12 04/04/2015 1433   LDLCALC 57  10/19/2023 1113   LDLCALC 59 03/11/2021 0813   LABVLDL 9 10/19/2023 1113      Lab Results  Component Value Date   TSH 1.860 10/19/2023   TSH 2.39 09/17/2021   TSH 3.06 03/11/2021   TSH 1.39 09/13/2019   TSH 1.28 10/25/2018   TSH 1.76 06/17/2017   TSH 1.10 04/04/2015   TSH 2.191 01/18/2014   TSH 2.850 10/31/2012   TSH 2.261 12/18/2011   FREET4 1.12 10/19/2023   FREET4 0.9 09/17/2021   FREET4 1.0 03/11/2021   FREET4 0.9 09/13/2019   FREET4 1.0 10/25/2018   FREET4 1.2 06/17/2017   FREET4 1.2 04/04/2015   FREET4 1.09 01/18/2014   FREET4 1.07 10/31/2012   FREET4 1.11 12/18/2011           Assessment & Plan:   1) Type 1 diabetes mellitus without complication (HCC)  She presents today with her CGM and Omnipod showing fluctuating but overall improving glycemic profile.  Her POCT A1c today is 7.8%, unchanged from last visit.  Analysis of her CGM shows TIR 57%, TAR 39%, TBR 4% with a GMI of 7.3%.  She denies any significant hypoglycemia.  She did have pilonidal cyst drained between visits.  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  - Alisha Church has currently uncontrolled symptomatic type 1 DM since 23 years of age.   -Recent labs reviewed.  POCT UM today was normal.  - I had a long discussion with her about the progressive nature of diabetes and the pathology behind its complications. -her diabetes is not currently complicated but she remains at a high risk for more acute and chronic complications which include CAD, CVA, CKD, retinopathy, and neuropathy. These  are all discussed in detail with her.  The following Lifestyle Medicine recommendations according to American College of Lifestyle Medicine [facility]) were discussed and offered to patient and she agrees to start the journey:  A. Whole Foods, Plant-based plate comprising of fruits and vegetables, plant-based proteins, whole-grain carbohydrates was discussed in detail with the patient.   A list for source of those nutrients were also provided to the patient.  Patient will use only water or unsweetened tea for hydration. B.  The need to stay away from risky substances including alcohol, smoking; obtaining 7 to 9 hours of restorative sleep, at least 150 minutes of moderate intensity exercise weekly, the importance of healthy social connections,  and stress reduction techniques were discussed. C.  A full color page of  Calorie density of various food groups per pound showing examples of each food groups was provided to the patient.  - Nutritional counseling repeated/built upon at each appointment.  - The patient admits there is a room for improvement in their diet and drink choices. -  Suggestion is made for the patient to avoid simple carbohydrates from their diet including Cakes, Sweet Desserts / Pastries, Ice Cream, Soda (diet and regular), Sweet Tea, Candies, Chips, Cookies, Sweet Pastries, Store Bought Juices, Alcohol in Excess of 1-2 drinks a day, Artificial Sweeteners, Coffee Creamer, and Sugar-free Products. This will help patient to have stable blood glucose profile and potentially avoid unintended weight gain.   - I encouraged the patient to switch to unprocessed or minimally processed complex starch and increased protein intake (animal or plant source), fruits, and vegetables.   - Patient is advised to stick to a routine mealtimes to eat 3 meals a day and avoid unnecessary snacks (to snack only to correct hypoglycemia).  -  I have approached her with the following individualized plan to manage  her diabetes and patient agrees:   -I did not make any setting adjustments today.  I did encourage her to be more aggressive with carb counting prior to her meals.  -she is encouraged to continue monitoring glucose 4 times daily (using her CGM), before meals and before bed.   - she is warned not to take insulin  without proper monitoring per orders. - Adjustment parameters are given to her for hypo and hyperglycemia in writing. - she is encouraged to call clinic for blood glucose levels less than 70 or above 300 mg /dl.  -Due to her type 1 diagnosis, she is not a candidate for non-insulin  therapies.  - Specific targets for  A1c; LDL, HDL, and Triglycerides were discussed with the patient.  2) Blood Pressure /Hypertension:  her blood pressure is controlled to target without the use of antihypertensive medications.     3) Lipids/Hyperlipidemia:    Review of her recent lipid panel from 10/19/23 showed controlled LDL at 57.  She is not currently on any lipid lowering medications.     4)  Weight/Diet:  her Body mass index is 27.44 kg/m.  -   she is NOT a candidate for weight loss.  Exercise, and detailed carbohydrates information provided  -  detailed on discharge instructions.  5) Chronic Care/Health Maintenance: -she is not on ACEI/ARB or Statin medications and is encouraged to initiate and continue to follow up with Ophthalmology, Dentist, Podiatrist at least yearly or according to recommendations, and advised to stay away from smoking. I have recommended yearly flu vaccine and pneumonia vaccine at least every 5 years; moderate intensity exercise for up to 150 minutes weekly; and sleep for at least 7 hours a day.  - she is advised to maintain close follow up with Joshua Santana CROME, NP for primary care needs, as well as her other providers for optimal and coordinated care.     I spent  39  minutes in the care of the patient today including review of labs from CMP, Lipids, Thyroid  Function,  Hematology (current and previous including abstractions from other facilities); face-to-face time discussing  her blood glucose readings/logs, discussing hypoglycemia and hyperglycemia episodes and symptoms, medications doses, her options of short and long term treatment based on the latest standards of care / guidelines;  discussion about incorporating lifestyle medicine;  and documenting the encounter. Risk reduction counseling performed per USPSTF guidelines to reduce obesity and cardiovascular risk factors.     Please refer to Patient Instructions for Blood Glucose Monitoring and Insulin /Medications Dosing Guide  in media tab for additional information. Please  also refer to  Patient Self Inventory in the Media  tab for reviewed elements of pertinent patient history.  Alisha Church participated in the discussions, expressed understanding, and voiced agreement with the above plans.  All questions were answered to her satisfaction. she is encouraged to contact clinic should she have any questions or concerns prior to her return visit.     Follow up plan: - Return in about 6 months (around 06/28/2024) for Diabetes F/U with A1c in office, No previsit labs, Bring meter and logs.  Benton Rio, [facility] [address] [place], [place] [address] Phone: [phone] Fax: [phone]  12/29/2023, 3:00 PM

## 2023-12-30 ENCOUNTER — Other Ambulatory Visit (HOSPITAL_COMMUNITY): Payer: Self-pay

## 2024-01-24 ENCOUNTER — Inpatient Hospital Stay: Attending: Nurse Practitioner

## 2024-01-25 ENCOUNTER — Other Ambulatory Visit: Payer: Self-pay

## 2024-01-26 ENCOUNTER — Encounter: Payer: Self-pay | Admitting: Pharmacist

## 2024-01-26 ENCOUNTER — Other Ambulatory Visit (HOSPITAL_COMMUNITY): Payer: Self-pay

## 2024-01-26 ENCOUNTER — Other Ambulatory Visit: Payer: Self-pay

## 2024-01-28 ENCOUNTER — Other Ambulatory Visit: Payer: Self-pay

## 2024-01-28 ENCOUNTER — Other Ambulatory Visit (HOSPITAL_COMMUNITY): Payer: Self-pay

## 2024-02-01 ENCOUNTER — Other Ambulatory Visit (HOSPITAL_COMMUNITY): Payer: Self-pay

## 2024-02-02 ENCOUNTER — Other Ambulatory Visit (HOSPITAL_COMMUNITY): Payer: Self-pay

## 2024-02-02 ENCOUNTER — Inpatient Hospital Stay: Attending: Nurse Practitioner

## 2024-02-02 DIAGNOSIS — D500 Iron deficiency anemia secondary to blood loss (chronic): Secondary | ICD-10-CM

## 2024-02-02 DIAGNOSIS — E538 Deficiency of other specified B group vitamins: Secondary | ICD-10-CM

## 2024-02-02 LAB — CMP (CANCER CENTER ONLY)
ALT: 9 U/L (ref 0–44)
AST: 13 U/L — ABNORMAL LOW (ref 15–41)
Albumin: 4.4 g/dL (ref 3.5–5.0)
Alkaline Phosphatase: 121 U/L (ref 38–126)
Anion gap: 9 (ref 5–15)
BUN: 9 mg/dL (ref 6–20)
CO2: 24 mmol/L (ref 22–32)
Calcium: 8.8 mg/dL — ABNORMAL LOW (ref 8.9–10.3)
Chloride: 104 mmol/L (ref 98–111)
Creatinine: 0.85 mg/dL (ref 0.44–1.00)
GFR, Estimated: >60 mL/min
Glucose, Bld: 247 mg/dL — ABNORMAL HIGH (ref 70–99)
Potassium: 4.1 mmol/L (ref 3.5–5.1)
Sodium: 137 mmol/L (ref 135–145)
Total Bilirubin: 0.4 mg/dL (ref 0.0–1.2)
Total Protein: 7.5 g/dL (ref 6.5–8.1)

## 2024-02-02 LAB — VITAMIN B12: Vitamin B-12: 400 pg/mL (ref 180–914)

## 2024-02-02 LAB — CBC WITH DIFFERENTIAL (CANCER CENTER ONLY)
Abs Immature Granulocytes: 0.02 K/uL (ref 0.00–0.07)
Basophils Absolute: 0 K/uL (ref 0.0–0.1)
Basophils Relative: 0 %
Eosinophils Absolute: 0.1 K/uL (ref 0.0–0.5)
Eosinophils Relative: 1 %
HCT: 32.8 % — ABNORMAL LOW (ref 36.0–46.0)
Hemoglobin: 10.6 g/dL — ABNORMAL LOW (ref 12.0–15.0)
Immature Granulocytes: 0 %
Lymphocytes Relative: 27 %
Lymphs Abs: 1.9 K/uL (ref 0.7–4.0)
MCH: 23.8 pg — ABNORMAL LOW (ref 26.0–34.0)
MCHC: 32.3 g/dL (ref 30.0–36.0)
MCV: 73.7 fL — ABNORMAL LOW (ref 80.0–100.0)
Monocytes Absolute: 0.4 K/uL (ref 0.1–1.0)
Monocytes Relative: 6 %
Neutro Abs: 4.5 K/uL (ref 1.7–7.7)
Neutrophils Relative %: 66 %
Platelet Count: 268 K/uL (ref 150–400)
RBC: 4.45 MIL/uL (ref 3.87–5.11)
RDW: 13.6 % (ref 11.5–15.5)
WBC Count: 6.9 K/uL (ref 4.0–10.5)
nRBC: 0 % (ref 0.0–0.2)

## 2024-02-02 LAB — FERRITIN: Ferritin: 17 ng/mL (ref 11–307)

## 2024-02-02 LAB — IRON AND IRON BINDING CAPACITY (CC-WL,HP ONLY)
Iron: 65 ug/dL (ref 28–170)
Saturation Ratios: 17 % (ref 10.4–31.8)
TIBC: 396 ug/dL (ref 250–450)
UIBC: 331 ug/dL

## 2024-02-03 ENCOUNTER — Other Ambulatory Visit: Payer: Self-pay | Admitting: Physician Assistant

## 2024-02-03 ENCOUNTER — Ambulatory Visit: Payer: Self-pay | Admitting: Physician Assistant

## 2024-02-07 ENCOUNTER — Telehealth: Payer: Self-pay | Admitting: Pharmacy Technician

## 2024-02-07 ENCOUNTER — Other Ambulatory Visit (HOSPITAL_COMMUNITY): Payer: Self-pay

## 2024-02-07 NOTE — Telephone Encounter (Signed)
 Auth Submission: NO AUTH NEEDED Site of care: Site of care: [facility] Payer: [employer] Medication & CPT/J Code(s) submitted: Venofer  (Iron  Sucrose) J1756 Diagnosis Code: D50.9 Route of submission (phone, fax, portal):  Phone # Fax # Auth type: Buy/Bill PB Units/visits requested: 5 DOSES Reference number:  Approval from: 02/07/24 to 05/25/24

## 2024-02-08 ENCOUNTER — Other Ambulatory Visit: Payer: Self-pay

## 2024-02-08 ENCOUNTER — Other Ambulatory Visit (HOSPITAL_COMMUNITY): Payer: Self-pay

## 2024-02-10 ENCOUNTER — Encounter: Payer: Self-pay | Admitting: Pharmacist

## 2024-02-10 ENCOUNTER — Other Ambulatory Visit: Payer: Self-pay

## 2024-02-11 ENCOUNTER — Other Ambulatory Visit: Payer: Self-pay

## 2024-02-11 ENCOUNTER — Other Ambulatory Visit (HOSPITAL_COMMUNITY): Payer: Self-pay

## 2024-02-14 ENCOUNTER — Encounter (HOSPITAL_COMMUNITY): Payer: Self-pay | Admitting: Pharmacist

## 2024-02-14 ENCOUNTER — Other Ambulatory Visit (HOSPITAL_COMMUNITY): Payer: Self-pay

## 2024-02-17 ENCOUNTER — Other Ambulatory Visit (HOSPITAL_COMMUNITY): Payer: Self-pay

## 2024-02-20 ENCOUNTER — Encounter (HOSPITAL_COMMUNITY): Payer: Self-pay

## 2024-02-21 ENCOUNTER — Other Ambulatory Visit (HOSPITAL_COMMUNITY): Payer: Self-pay

## 2024-02-22 ENCOUNTER — Encounter: Payer: Self-pay | Admitting: Nurse Practitioner

## 2024-02-22 ENCOUNTER — Other Ambulatory Visit (HOSPITAL_COMMUNITY): Payer: Self-pay

## 2024-02-22 ENCOUNTER — Other Ambulatory Visit: Payer: Self-pay

## 2024-02-23 ENCOUNTER — Other Ambulatory Visit: Payer: Self-pay

## 2024-02-23 ENCOUNTER — Other Ambulatory Visit (HOSPITAL_COMMUNITY): Payer: Self-pay

## 2024-02-29 ENCOUNTER — Other Ambulatory Visit (HOSPITAL_COMMUNITY): Payer: Self-pay

## 2024-05-02 ENCOUNTER — Other Ambulatory Visit

## 2024-05-02 ENCOUNTER — Ambulatory Visit: Admitting: Physician Assistant

## 2024-06-28 ENCOUNTER — Ambulatory Visit: Admitting: Nurse Practitioner
